# Patient Record
Sex: Female | Born: 1945 | Race: White | Hispanic: No | Marital: Married | Smoking: Current every day smoker
Health system: Southern US, Community
[De-identification: ages and names within clinical notes are randomized; demographics above are authoritative.]

## PROBLEM LIST (undated history)

## (undated) DIAGNOSIS — I2510 Atherosclerotic heart disease of native coronary artery without angina pectoris: Secondary | ICD-10-CM

## (undated) DIAGNOSIS — Z720 Tobacco use: Secondary | ICD-10-CM

## (undated) DIAGNOSIS — E785 Hyperlipidemia, unspecified: Secondary | ICD-10-CM

## (undated) DIAGNOSIS — I10 Essential (primary) hypertension: Secondary | ICD-10-CM

## (undated) DIAGNOSIS — E039 Hypothyroidism, unspecified: Secondary | ICD-10-CM

## (undated) DIAGNOSIS — G8929 Other chronic pain: Secondary | ICD-10-CM

---

## 2016-03-02 HISTORY — PX: LAPAROSCOPIC CHOLECYSTECTOMY: SUR755

## 2020-05-12 ENCOUNTER — Encounter: Payer: Self-pay | Admitting: Emergency Medicine

## 2020-05-12 ENCOUNTER — Emergency Department: Payer: Medicare HMO

## 2020-05-12 ENCOUNTER — Other Ambulatory Visit: Payer: Self-pay

## 2020-05-12 ENCOUNTER — Inpatient Hospital Stay
Admission: EM | Admit: 2020-05-12 | Discharge: 2020-05-27 | DRG: 480 | Disposition: A | Discharge status: Skilled Nursing Facility | Payer: Medicare HMO | Attending: Internal Medicine | Admitting: Internal Medicine

## 2020-05-12 DIAGNOSIS — E43 Unspecified severe protein-calorie malnutrition: Secondary | ICD-10-CM | POA: Insufficient documentation

## 2020-05-12 DIAGNOSIS — K567 Ileus, unspecified: Secondary | ICD-10-CM | POA: Diagnosis not present

## 2020-05-12 DIAGNOSIS — Z20822 Contact with and (suspected) exposure to covid-19: Secondary | ICD-10-CM | POA: Diagnosis present

## 2020-05-12 DIAGNOSIS — R609 Edema, unspecified: Secondary | ICD-10-CM

## 2020-05-12 DIAGNOSIS — K219 Gastro-esophageal reflux disease without esophagitis: Secondary | ICD-10-CM | POA: Diagnosis present

## 2020-05-12 DIAGNOSIS — M545 Low back pain: Secondary | ICD-10-CM | POA: Diagnosis not present

## 2020-05-12 DIAGNOSIS — Z4659 Encounter for fitting and adjustment of other gastrointestinal appliance and device: Secondary | ICD-10-CM

## 2020-05-12 DIAGNOSIS — S72141A Displaced intertrochanteric fracture of right femur, initial encounter for closed fracture: Secondary | ICD-10-CM | POA: Diagnosis present

## 2020-05-12 DIAGNOSIS — Z0189 Encounter for other specified special examinations: Secondary | ICD-10-CM

## 2020-05-12 DIAGNOSIS — G8929 Other chronic pain: Secondary | ICD-10-CM | POA: Diagnosis present

## 2020-05-12 DIAGNOSIS — Z9049 Acquired absence of other specified parts of digestive tract: Secondary | ICD-10-CM

## 2020-05-12 DIAGNOSIS — R252 Cramp and spasm: Secondary | ICD-10-CM | POA: Diagnosis not present

## 2020-05-12 DIAGNOSIS — I2510 Atherosclerotic heart disease of native coronary artery without angina pectoris: Secondary | ICD-10-CM | POA: Diagnosis present

## 2020-05-12 DIAGNOSIS — E785 Hyperlipidemia, unspecified: Secondary | ICD-10-CM | POA: Diagnosis present

## 2020-05-12 DIAGNOSIS — Z681 Body mass index (BMI) 19 or less, adult: Secondary | ICD-10-CM

## 2020-05-12 DIAGNOSIS — E039 Hypothyroidism, unspecified: Secondary | ICD-10-CM | POA: Diagnosis present

## 2020-05-12 DIAGNOSIS — W010XXA Fall on same level from slipping, tripping and stumbling without subsequent striking against object, initial encounter: Secondary | ICD-10-CM | POA: Diagnosis present

## 2020-05-12 DIAGNOSIS — Z882 Allergy status to sulfonamides status: Secondary | ICD-10-CM

## 2020-05-12 DIAGNOSIS — R52 Pain, unspecified: Secondary | ICD-10-CM

## 2020-05-12 DIAGNOSIS — I10 Essential (primary) hypertension: Secondary | ICD-10-CM | POA: Diagnosis present

## 2020-05-12 DIAGNOSIS — E876 Hypokalemia: Secondary | ICD-10-CM | POA: Diagnosis present

## 2020-05-12 DIAGNOSIS — E222 Syndrome of inappropriate secretion of antidiuretic hormone: Secondary | ICD-10-CM | POA: Diagnosis present

## 2020-05-12 DIAGNOSIS — F419 Anxiety disorder, unspecified: Secondary | ICD-10-CM | POA: Diagnosis present

## 2020-05-12 DIAGNOSIS — E871 Hypo-osmolality and hyponatremia: Secondary | ICD-10-CM | POA: Diagnosis not present

## 2020-05-12 DIAGNOSIS — M1711 Unilateral primary osteoarthritis, right knee: Secondary | ICD-10-CM | POA: Diagnosis present

## 2020-05-12 DIAGNOSIS — Z720 Tobacco use: Secondary | ICD-10-CM | POA: Diagnosis present

## 2020-05-12 DIAGNOSIS — F17210 Nicotine dependence, cigarettes, uncomplicated: Secondary | ICD-10-CM | POA: Diagnosis present

## 2020-05-12 DIAGNOSIS — Z955 Presence of coronary angioplasty implant and graft: Secondary | ICD-10-CM

## 2020-05-12 DIAGNOSIS — S72001A Fracture of unspecified part of neck of right femur, initial encounter for closed fracture: Secondary | ICD-10-CM | POA: Diagnosis not present

## 2020-05-12 DIAGNOSIS — R140 Abdominal distension (gaseous): Secondary | ICD-10-CM

## 2020-05-12 DIAGNOSIS — D62 Acute posthemorrhagic anemia: Secondary | ICD-10-CM | POA: Diagnosis not present

## 2020-05-12 DIAGNOSIS — Z419 Encounter for procedure for purposes other than remedying health state, unspecified: Secondary | ICD-10-CM

## 2020-05-12 DIAGNOSIS — W19XXXA Unspecified fall, initial encounter: Secondary | ICD-10-CM

## 2020-05-12 HISTORY — DX: Essential (primary) hypertension: I10

## 2020-05-12 HISTORY — DX: Hypothyroidism, unspecified: E03.9

## 2020-05-12 HISTORY — DX: Atherosclerotic heart disease of native coronary artery without angina pectoris: I25.10

## 2020-05-12 HISTORY — DX: Other chronic pain: G89.29

## 2020-05-12 HISTORY — DX: Hyperlipidemia, unspecified: E78.5

## 2020-05-12 HISTORY — DX: Tobacco use: Z72.0

## 2020-05-12 LAB — CBC
HCT: 32.7 % — ABNORMAL LOW (ref 36.0–46.0)
Hemoglobin: 12 g/dL (ref 12.0–15.0)
MCH: 30.5 pg (ref 26.0–34.0)
MCHC: 36.7 g/dL — ABNORMAL HIGH (ref 30.0–36.0)
MCV: 83.2 fL (ref 80.0–100.0)
Platelets: 284 10*3/uL (ref 150–400)
RBC: 3.93 MIL/uL (ref 3.87–5.11)
RDW: 14.4 % (ref 11.5–15.5)
WBC: 19.3 10*3/uL — ABNORMAL HIGH (ref 4.0–10.5)
nRBC: 0 % (ref 0.0–0.2)

## 2020-05-12 LAB — SARS CORONAVIRUS 2 BY RT PCR (HOSPITAL ORDER, PERFORMED IN ~~LOC~~ HOSPITAL LAB): SARS Coronavirus 2: NEGATIVE

## 2020-05-12 LAB — MAGNESIUM: Magnesium: 1.9 mg/dL (ref 1.7–2.4)

## 2020-05-12 LAB — BASIC METABOLIC PANEL
Anion gap: 7 (ref 5–15)
BUN: 10 mg/dL (ref 8–23)
CO2: 25 mmol/L (ref 22–32)
Calcium: 8.7 mg/dL — ABNORMAL LOW (ref 8.9–10.3)
Chloride: 90 mmol/L — ABNORMAL LOW (ref 98–111)
Creatinine, Ser: 0.72 mg/dL (ref 0.44–1.00)
GFR calc Af Amer: >60 mL/min (ref >60)
GFR calc non Af Amer: >60 mL/min (ref >60)
Glucose, Bld: 118 mg/dL — ABNORMAL HIGH (ref 70–99)
Potassium: 3.3 mmol/L — ABNORMAL LOW (ref 3.5–5.1)
Sodium: 122 mmol/L — ABNORMAL LOW (ref 135–145)

## 2020-05-12 LAB — PROTIME-INR
INR: 1 (ref 0.8–1.2)
Prothrombin Time: 12.8 seconds (ref 11.4–15.2)

## 2020-05-12 LAB — TSH: TSH: 2.724 u[IU]/mL (ref 0.350–4.500)

## 2020-05-12 LAB — OSMOLALITY: Osmolality: 260 mOsm/kg — ABNORMAL LOW (ref 275–295)

## 2020-05-12 LAB — TYPE AND SCREEN
ABO/RH(D): A POS
Antibody Screen: NEGATIVE

## 2020-05-12 MED ORDER — OXYCODONE-ACETAMINOPHEN 5-325 MG PO TABS: 1.0000 | ORAL_TABLET | ORAL | 0 refills | Status: DC | PRN

## 2020-05-12 MED ORDER — LISINOPRIL 20 MG PO TABS
20.0000 mg | ORAL_TABLET | Freq: Every day | ORAL | Status: DC
  Administered 2020-05-14 – 2020-05-27 (×14): 20 mg via ORAL
  Filled 2020-05-12 (×14): qty 1

## 2020-05-12 MED ORDER — MORPHINE SULFATE (PF) 4 MG/ML IV SOLN
4.0000 mg | Freq: Once | INTRAVENOUS | Status: AC
  Administered 2020-05-12: 4 mg via INTRAVENOUS
  Filled 2020-05-12: qty 1

## 2020-05-12 MED ORDER — AMLODIPINE BESYLATE 10 MG PO TABS
10.0000 mg | ORAL_TABLET | Freq: Every day | ORAL | Status: DC
  Administered 2020-05-14 – 2020-05-27 (×14): 10 mg via ORAL
  Filled 2020-05-12 (×14): qty 1

## 2020-05-12 MED ORDER — OXYCODONE HCL 5 MG PO TABS
10.0000 mg | ORAL_TABLET | Freq: Four times a day (QID) | ORAL | Status: DC | PRN
  Administered 2020-05-12 – 2020-05-14 (×3): 10 mg via ORAL
  Filled 2020-05-12 (×3): qty 2

## 2020-05-12 MED ORDER — PROMETHAZINE HCL 25 MG PO TABS
25.0000 mg | ORAL_TABLET | Freq: Three times a day (TID) | ORAL | Status: DC | PRN
  Administered 2020-05-12: 25 mg via ORAL
  Filled 2020-05-12 (×2): qty 1

## 2020-05-12 MED ORDER — METHYLPREDNISOLONE ACETATE 80 MG/ML IJ SUSP
80.0000 mg | Freq: Once | INTRAMUSCULAR | Status: AC
  Administered 2020-05-12: 80 mg via INTRAMUSCULAR
  Filled 2020-05-12: qty 1

## 2020-05-12 MED ORDER — LEVOTHYROXINE SODIUM 25 MCG PO TABS
125.0000 ug | ORAL_TABLET | Freq: Every day | ORAL | Status: DC
  Administered 2020-05-14 – 2020-05-27 (×13): 125 ug via ORAL
  Filled 2020-05-12 (×13): qty 1

## 2020-05-12 MED ORDER — CYCLOBENZAPRINE HCL 10 MG PO TABS
5.0000 mg | ORAL_TABLET | Freq: Three times a day (TID) | ORAL | Status: DC | PRN
  Administered 2020-05-12 – 2020-05-17 (×6): 5 mg via ORAL
  Filled 2020-05-12 (×7): qty 1

## 2020-05-12 MED ORDER — HYDROCODONE-ACETAMINOPHEN 5-325 MG PO TABS
1.0000 | ORAL_TABLET | Freq: Four times a day (QID) | ORAL | Status: DC | PRN
  Administered 2020-05-12: 1 via ORAL
  Filled 2020-05-12: qty 1

## 2020-05-12 MED ORDER — HYDROMORPHONE HCL 1 MG/ML IJ SOLN
1.0000 mg | Freq: Once | INTRAMUSCULAR | Status: AC
  Administered 2020-05-12: 1 mg via INTRAVENOUS
  Filled 2020-05-12: qty 1

## 2020-05-12 MED ORDER — POTASSIUM CHLORIDE IN NACL 20-0.9 MEQ/L-% IV SOLN
INTRAVENOUS | Status: AC
  Filled 2020-05-12: qty 1000

## 2020-05-12 MED ORDER — SENNOSIDES-DOCUSATE SODIUM 8.6-50 MG PO TABS: 1.0000 | ORAL_TABLET | Freq: Every evening | ORAL | Status: DC | PRN

## 2020-05-12 MED ORDER — MORPHINE SULFATE (PF) 2 MG/ML IV SOLN
2.0000 mg | INTRAVENOUS | Status: DC | PRN
  Administered 2020-05-12 – 2020-05-13 (×4): 2 mg via INTRAVENOUS
  Filled 2020-05-12 (×5): qty 1

## 2020-05-12 MED ORDER — ONDANSETRON HCL 4 MG/2ML IJ SOLN
4.0000 mg | Freq: Once | INTRAMUSCULAR | Status: AC
  Administered 2020-05-12: 4 mg via INTRAVENOUS
  Filled 2020-05-12: qty 2

## 2020-05-12 MED ORDER — MORPHINE SULFATE (PF) 2 MG/ML IV SOLN
0.5000 mg | INTRAVENOUS | Status: DC | PRN
  Administered 2020-05-12 (×2): 0.5 mg via INTRAVENOUS
  Filled 2020-05-12 (×2): qty 1

## 2020-05-12 MED ORDER — ONDANSETRON HCL 4 MG/2ML IJ SOLN
4.0000 mg | Freq: Four times a day (QID) | INTRAMUSCULAR | Status: DC | PRN
  Administered 2020-05-12 – 2020-05-13 (×2): 4 mg via INTRAVENOUS
  Filled 2020-05-12 (×4): qty 2

## 2020-05-12 MED ORDER — PANTOPRAZOLE SODIUM 40 MG PO TBEC
40.0000 mg | DELAYED_RELEASE_TABLET | Freq: Every day | ORAL | Status: DC
  Administered 2020-05-14 – 2020-05-27 (×13): 40 mg via ORAL
  Filled 2020-05-12 (×13): qty 1

## 2020-05-12 MED ORDER — CARVEDILOL 3.125 MG PO TABS
3.1250 mg | ORAL_TABLET | Freq: Two times a day (BID) | ORAL | Status: DC
  Administered 2020-05-12 – 2020-05-27 (×29): 3.125 mg via ORAL
  Filled 2020-05-12 (×29): qty 1

## 2020-05-12 MED ORDER — PROMETHAZINE HCL 25 MG PO TABS
25.0000 mg | ORAL_TABLET | Freq: Once | ORAL | Status: AC
  Administered 2020-05-12: 25 mg via ORAL
  Filled 2020-05-12: qty 1

## 2020-05-12 MED ORDER — POTASSIUM CHLORIDE 20 MEQ/15ML (10%) PO SOLN
40.0000 meq | Freq: Once | ORAL | Status: AC
  Administered 2020-05-12: 40 meq via ORAL
  Filled 2020-05-12: qty 30

## 2020-05-12 MED ORDER — PRAVASTATIN SODIUM 20 MG PO TABS
20.0000 mg | ORAL_TABLET | Freq: Every day | ORAL | Status: DC
  Administered 2020-05-14 – 2020-05-26 (×12): 20 mg via ORAL
  Filled 2020-05-12 (×13): qty 1

## 2020-05-12 MED ORDER — CYCLOBENZAPRINE HCL 10 MG PO TABS
5.0000 mg | ORAL_TABLET | Freq: Once | ORAL | Status: AC
  Administered 2020-05-12: 5 mg via ORAL
  Filled 2020-05-12: qty 1

## 2020-05-12 NOTE — ED Triage Notes (Signed)
Pt arrival via [facility] from home due to fall. Patient states that she lost her footing and fell on her right side. Patient is complaining of right leg, knee, and hip pain and some knee deformity according to EMS. Patinet received 4 of zofran and 100 mcg of fentanyl in route IV. HR 90's, R 20, CBG 138, 95% RA, 174/86 BP.   Pt pain 10/10 now.

## 2020-05-12 NOTE — ED Notes (Signed)
Report provided   

## 2020-05-12 NOTE — H&P (Signed)
History and Physical    Rebecca Peck CVE:[id number] DOB: 26-Sep-1946 DOA: 05/12/2020  PCP: Patient, No Pcp Per  Patient coming from: Home via EMS  I have personally briefly reviewed patient's old medical records in [facility]  Chief Complaint: Fall with right leg pain  HPI: Rebecca Peck is a 74 y.o. female with medical history significant for CAD with remote stent placement, hypertension, hyperlipidemia, hypothyroidism, GERD, tobacco use, and chronic back pain who presents to the ED for evaluation of right leg pain after a fall.  Patient states she was at home when she tripped and fell onto her right side.  She says she did not hit her head or lose consciousness.  She was having significant pain in her right leg most notably at the knee.  She was unable to stand or bear weight on her own.  She came to the ED for further evaluation.  She denies any associated chest pain, palpitations, dyspnea, cough, nausea, vomiting, abdominal pain, dysuria, diarrhea.  She denies any recent changes in her medications.  She reports chronic tobacco use, smoking half a pack per day.  She denies any alcohol or illicit drug use.  She reports a family history of an unspecified blood disorder in her son.  ED Course:  Initial vitals showed BP 169/91, pulse 83, RR 18, temp 98.0 Fahrenheit, SPO2 97% on room air.  Labs not obtained yet.  Right knee x-ray shows tricompartmental osteoarthritis without acute osseous abnormality.  Pelvic and right femur x-rays show a right femoral intertrochanteric fracture with varus angulation.  Portable chest x-ray is negative for focal consolidation, edema, or effusion.  Patient was given IV morphine, Dilaudid, IM Depo-Medrol 80 mg, and Zofran.  EDP discussed with on-call orthopedics who recommended medical admission with plan for surgical fixation tomorrow.  The hospitalist service was consulted admit for further evaluation and management.  Review of Systems: All  systems reviewed and are negative except as documented in history of present illness above.   Past Medical History:  Diagnosis Date  . CAD (coronary artery disease)   . Chronic back pain   . Hyperlipidemia   . Hypertension   . Hypothyroidism   . Tobacco use     Past Surgical History:  Procedure Laterality Date  . LAPAROSCOPIC CHOLECYSTECTOMY  03/02/2016    Social History:  reports that she has been smoking. She has been smoking about 0.50 packs per day. She has never used smokeless tobacco. She reports previous alcohol use. No history on file for drug use.  Allergies  Allergen Reactions  . Sulfa Antibiotics Rash    History reviewed.  Patient reports unspecified blood disorder in herself.   Prior to Admission medications   Medication Sig Start Date End Date Taking? Authorizing Provider  oxyCODONE-acetaminophen (PERCOCET) 5-325 MG tablet Take 1 tablet by mouth every 4 (four) hours as needed for severe pain. 05/12/20 05/12/21  Arnaldo Natal, MD    Physical Exam: Vitals:   05/12/20 1551 05/12/20 1556 05/12/20 1730  BP:  (!) 169/91 (!) 162/85  Pulse:  83 81  Resp:  18 16  Temp:  98 F (36.7 C)   TempSrc:  Oral   SpO2:  97% 97%  Weight: 55.3 kg    Height: 5\' 6"  (1.676 m)     Constitutional: Elderly woman resting supine in bed, NAD, calm, intermittent pain spells with minimal movement of her right hip Eyes: PERRL, lids and conjunctivae normal ENMT: Mucous membranes are dry. Posterior pharynx clear  of any exudate or lesions.Normal dentition.  Neck: normal, supple, no masses. Respiratory: clear to auscultation anteriorly normal respiratory effort. No accessory muscle use.  Cardiovascular: Regular rate and rhythm, no murmurs / rubs / gallops. No extremity edema. 2+ pedal pulses. Abdomen: no tenderness, no masses palpated. No hepatosplenomegaly. Bowel sounds positive.  Musculoskeletal: Right hip externally rotated, right foot everted, ROM otherwise intact in upper and left  lower extremities. skin: no rashes, lesions, ulcers. No induration Neurologic: CN 2-12 grossly intact. Sensation intact, Strength limited at the right hip due to pain/fracture otherwise 5/5 in all other extremities. Psychiatric: Normal judgment and insight. Alert and oriented x 3. Normal mood.   Labs on Admission: I have personally reviewed following labs and imaging studies  CBC: No results for input(s): WBC, NEUTROABS, HGB, HCT, MCV, PLT in the last 168 hours. Basic Metabolic Panel: No results for input(s): NA, K, CL, CO2, GLUCOSE, BUN, CREATININE, CALCIUM, MG, PHOS in the last 168 hours. GFR: CrCl cannot be calculated (No successful lab value found.). Liver Function Tests: No results for input(s): AST, ALT, ALKPHOS, BILITOT, PROT, ALBUMIN in the last 168 hours. No results for input(s): LIPASE, AMYLASE in the last 168 hours. No results for input(s): AMMONIA in the last 168 hours. Coagulation Profile: No results for input(s): INR, PROTIME in the last 168 hours. Cardiac Enzymes: No results for input(s): CKTOTAL, CKMB, CKMBINDEX, TROPONINI in the last 168 hours. BNP (last 3 results) No results for input(s): PROBNP in the last 8760 hours. HbA1C: No results for input(s): HGBA1C in the last 72 hours. CBG: No results for input(s): GLUCAP in the last 168 hours. Lipid Profile: No results for input(s): CHOL, HDL, LDLCALC, TRIG, CHOLHDL, LDLDIRECT in the last 72 hours. Thyroid Function Tests: No results for input(s): TSH, T4TOTAL, FREET4, T3FREE, THYROIDAB in the last 72 hours. Anemia Panel: No results for input(s): VITAMINB12, FOLATE, FERRITIN, TIBC, IRON, RETICCTPCT in the last 72 hours. Urine analysis: No results found for: COLORURINE, APPEARANCEUR, LABSPEC, PHURINE, GLUCOSEU, HGBUR, BILIRUBINUR, KETONESUR, PROTEINUR, UROBILINOGEN, NITRITE, LEUKOCYTESUR  Radiological Exams on Admission: DG Chest 1 View  Result Date: 05/12/2020 CLINICAL DATA:  Fall EXAM: CHEST  1 VIEW COMPARISON:   None. FINDINGS: Heart and mediastinal contours are within normal limits. No focal opacities or effusions. No acute bony abnormality. Aortic atherosclerosis. No pneumothorax. IMPRESSION: No active disease. Electronically Signed   By: Charlett Nose M.D.   On: 05/12/2020 18:14   DG Pelvis 1-2 Views  Result Date: 05/12/2020 CLINICAL DATA:  Fall, right hip pain, leg pain EXAM: PELVIS - 1-2 VIEW COMPARISON:  None. FINDINGS: There is a right femoral intertrochanteric fracture with varus angulation. No subluxation or dislocation. Mild symmetric degenerative changes in the hips. IMPRESSION: Right femoral intertrochanteric fracture with varus angulation. Electronically Signed   By: Charlett Nose M.D.   On: 05/12/2020 18:13   DG Knee 1-2 Views Right  Result Date: 05/12/2020 CLINICAL DATA:  Fall. EXAM: RIGHT KNEE - 1-2 VIEW COMPARISON:  None. FINDINGS: No acute fracture or dislocation. Trace suprapatellar joint effusion. Moderate medial and lateral compartment joint space narrowing. Small tricompartmental marginal osteophytes. Osteopenia. Soft tissues are unremarkable. Vascular calcifications. IMPRESSION: 1. No acute osseous abnormality. 2. Tricompartmental osteoarthritis. Electronically Signed   By: Obie Dredge M.D.   On: 05/12/2020 16:36   DG Femur Min 2 Views Right  Result Date: 05/12/2020 CLINICAL DATA:  Fall.  Right hip pain EXAM: RIGHT FEMUR 2 VIEWS COMPARISON:  None. FINDINGS: There is a right femoral intertrochanteric fracture with varus angulation.  No subluxation or dislocation. No additional acute femoral abnormality. No joint effusion within the right knee. IMPRESSION: Right femoral intertrochanteric fracture with varus angulation. Electronically Signed   By: Charlett Nose M.D.   On: 05/12/2020 18:12    EKG: Ordered and pending.  Assessment/Plan Principal Problem:   Intertrochanteric fracture of right femur, closed, initial encounter (HCC) Active Problems:   Hypothyroidism   Hypertension    Hyperlipidemia   Chronic back pain   CAD (coronary artery disease)   Tobacco use  Rebecca Peck is a 74 y.o. female with medical history significant for CAD with remote stent placement, hypertension, hyperlipidemia, hypothyroidism, GERD, tobacco use, and chronic back pain who is admitted with a right hip fracture.  Right hip fracture: Occurring after a mechanical fall.  Orthopedics consulted and plan for surgical fixation tomorrow.  Patient has a history of CAD with remote stent placement, denies any recent chest pain.  She would be considered to have a 6% perioperative 30-day risk of death, MI, or cardiac arrest. -Obtain baseline labs with CBC, BMP, type and screen -Continue pain control with home oxycodone and IV morphine as needed with hold parameters -Keep n.p.o. at midnight  CAD: Patient reports history of stenting years ago.  Denies any recent chest pain. -Continue carvedilol and pravastatin  Chronic hyponatremia: Review of labs in care everywhere show chronic mild hyponatremia.  Obtain BMP.  Hypertension: -Continue carvedilol, amlodipine, and lisinopril  Hyperlipidemia: -Continue pravastatin  Hypothyroidism: -Continue Synthroid  Chronic back pain: Continue home oxycodone and add IV morphine as needed as above.  DVT prophylaxis: SCDs Code Status: Full code, confirmed with patient Family Communication: Discussed with patient's son at bedside Disposition Plan: From home, anticipate discharge to SNF pending orthopedic intervention and postop recovery, PT/OT eval's Consults called: Orthopedics Admission status:  Status is: Inpatient  Remains inpatient appropriate because:Ongoing active pain requiring inpatient pain management, Unsafe d/c plan and Inpatient level of care appropriate due to severity of illness   Dispo: The patient is from: Home              Anticipated d/c is to: Home versus SNF pending orthopedic intervention and postop recovery and PT/OT eval's               Anticipated d/c date is: 3 days              Patient currently is not medically stable to d/c.  Darreld Mclean MD [facility]  If 7PM-7AM, please contact night-coverage [email or web address]  05/12/2020, 7:47 PM

## 2020-05-12 NOTE — Discharge Instructions (Addendum)
Diet: As you were doing prior to hospitalization   Shower:  May shower but keep the wounds dry, use an occlusive plastic wrap, NO SOAKING IN TUB.  If the bandage gets wet, change with a clean dry gauze.  Dressing:  You may change your dressing as needed. Change the dressing with sterile gauze dressing.    Activity:  Increase activity slowly as tolerated, but follow the weight bearing instructions below.  No lifting or driving for 6 weeks.  Weight Bearing:   Weight bearing as tolerated to right lower extremity  To prevent constipation: you may use a stool softener such as -  Colace (over the counter) 100 mg by mouth twice a day  Drink plenty of fluids (prune juice may be helpful) and high fiber foods Miralax (over the counter) for constipation as needed.    Itching:  If you experience itching with your medications, try taking only a single pain pill, or even half a pain pill at a time.  You may take up to 10 pain pills per day, and you can also use benadryl over the counter for itching or also to help with sleep.   Precautions:  If you experience chest pain or shortness of breath - call 911 immediately for transfer to the hospital emergency department!!  If you develop a fever greater that 101 F, purulent drainage from wound, increased redness or drainage from wound, or calf pain-Call [facility]                                              Follow- Up Appointment:  Please call for an appointment to be seen in 2 weeks at [facility].  #1.  Fluid restriction less than 1500 mL/day for hyponatremia.

## 2020-05-12 NOTE — ED Provider Notes (Addendum)
[facility] Emergency Department Provider Note   ____________________________________________   First MD Initiated Contact with Patient 05/12/20 1556     (approximate)  I have reviewed the triage vital signs and the nursing notes.   HISTORY  Chief Complaint Fall    HPI Rebecca Peck is a 74 y.o. female who reports she lost her footing and fell.  She landed on her right side.  She has pain in her right leg.  Most of the pain appears to be centered on her right knee which she cannot straighten.  Knee is very tender to palpation.  Her hips are not tender to palpation either is the proximal thigh or shin.         History reviewed. No pertinent past medical history.  There are no problems to display for this patient.   History reviewed. No pertinent surgical history.  Prior to Admission medications   Medication Sig Start Date End Date Taking? Authorizing Provider  oxyCODONE-acetaminophen (PERCOCET) 5-325 MG tablet Take 1 tablet by mouth every 4 (four) hours as needed for severe pain. 05/12/20 05/12/21  Arnaldo Natal, MD    Allergies Patient has no known allergies.  History reviewed. No pertinent family history.  Social History Social History   Tobacco Use  . Smoking status: Current Every Day Smoker    Packs/day: 0.50  . Smokeless tobacco: Never Used  Substance Use Topics  . Alcohol use: Not Currently  . Drug use: Not on file    Review of Systems  Constitutional: No fever/chills Eyes: No visual changes. ENT: No sore throat. Cardiovascular: Denies chest pain. Respiratory: Denies shortness of breath. Gastrointestinal: No abdominal pain.  No nausea, no vomiting.  No diarrhea.  No constipation. Genitourinary: Negative for dysuria. Musculoskeletal: Negative for back pain. Skin: Negative for rash. Neurological: Negative for headaches, focal weakness   ____________________________________________   PHYSICAL EXAM:  VITAL  SIGNS: ED Triage Vitals  Enc Vitals Group     BP 05/12/20 1556 (!) 169/91     Pulse Rate 05/12/20 1556 83     Resp 05/12/20 1556 18     Temp 05/12/20 1556 98 F (36.7 C)     Temp Source 05/12/20 1556 Oral     SpO2 05/12/20 1556 97 %     Weight 05/12/20 1551 122 lb (55.3 kg)     Height 05/12/20 1551 5\' 6"  (1.676 m)     Head Circumference --      Peak Flow --      Pain Score 05/12/20 1550 10     Pain Loc --      Pain Edu? --      Excl. in GC? --     Constitutional: Alert and oriented.  Complaining of pain in the leg Eyes: Conjunctivae are normal. PER. EOMI. Head: Atraumatic. Nose: No congestion/rhinnorhea. Mouth/Throat: Mucous membranes are moist.  Oropharynx non-erythematous. Neck: No stridor.  No cervical spine tenderness to palpation. Cardiovascular: Normal rate, regular rhythm. Grossly normal heart sounds.  Good peripheral circulation. Respiratory: Normal respiratory effort.  No retractions. Lungs CTAB. Gastrointestinal: Soft and nontender. No distention. No abdominal bruits. No CVA tenderness. Musculoskeletal: No lower extremity tenderness nor edema except as noted in HPI.   Neurologic:  Normal speech and language. No gross focal neurologic deficits are appreciated.  Skin:  Skin is warm, dry and intact. No rash noted.   ____________________________________________   LABS (all labs ordered are listed, but only abnormal results are displayed)  Labs Reviewed  SARS CORONAVIRUS 2 BY RT PCR (HOSPITAL ORDER, PERFORMED IN [facility])   ____________________________________________  EKG   ____________________________________________  RADIOLOGY  ED MD interpretation  Official radiology report(s): DG Knee 1-2 Views Right  Result Date: 05/12/2020 CLINICAL DATA:  Fall. EXAM: RIGHT KNEE - 1-2 VIEW COMPARISON:  None. FINDINGS: No acute fracture or dislocation. Trace suprapatellar joint effusion. Moderate medial and lateral compartment joint space narrowing.  Small tricompartmental marginal osteophytes. Osteopenia. Soft tissues are unremarkable. Vascular calcifications. IMPRESSION: 1. No acute osseous abnormality. 2. Tricompartmental osteoarthritis. Electronically Signed   By: Obie Dredge M.D.   On: 05/12/2020 16:36    ____________________________________________   PROCEDURES  Procedure(s) performed (including Critical Care):  Procedures   ____________________________________________   INITIAL IMPRESSION / ASSESSMENT AND PLAN / ED COURSE    Discussed patient in detail with Dr. Trilby Drummer orthopedics.  He also reviewed the x-rays.  There are no fractures.  She does have bad osteoarthritis.  She seems to have a locked joint.  We will splint her the way she is.  Dr. Loreta Ave suggested some I am Depo-Medrol to see if that will help her pain and inflammation.  He will follow her up in the office in a couple days.  Meantime we will have her walk on crutches were knee immobilizer and give her some pain medicine.  Since she has an acute injury hopefully this will not interfere with her pain clinic.    Patient's family members here with her.  Asked me to x-ray her hip just to make sure even though it is again not painful when I palpated her hip with him.  Hip x-ray shows a displaced fracture.  I have called Dr. Rosita Kea we will admit this lady          ____________________________________________   FINAL CLINICAL IMPRESSION(S) / ED DIAGNOSES  Final diagnoses:  Closed fracture of right hip, initial encounter [facility])     ED Discharge Orders         Ordered    oxyCODONE-acetaminophen (PERCOCET) 5-325 MG tablet  Every 4 hours PRN     Discontinue  Reprint     05/12/20 1729           Note:  This document was prepared using Dragon voice recognition software and may include unintentional dictation errors.    Arnaldo Natal, MD 05/12/20 1729    Arnaldo Natal, MD 05/12/20 [phone]

## 2020-05-12 NOTE — ED Notes (Signed)
Pt states that she didn't hit her head or loss consciousness on her fall. Patient is having pain in the right side of her body.

## 2020-05-13 ENCOUNTER — Encounter
Admission: EM | Disposition: A | Discharge status: Skilled Nursing Facility | Payer: Self-pay | Source: Home / Self Care | Attending: Internal Medicine

## 2020-05-13 ENCOUNTER — Inpatient Hospital Stay: Payer: Medicare HMO | Admitting: Anesthesiology

## 2020-05-13 ENCOUNTER — Inpatient Hospital Stay: Payer: Medicare HMO

## 2020-05-13 ENCOUNTER — Encounter: Payer: Self-pay | Admitting: Internal Medicine

## 2020-05-13 DIAGNOSIS — S72141A Displaced intertrochanteric fracture of right femur, initial encounter for closed fracture: Principal | ICD-10-CM

## 2020-05-13 HISTORY — PX: FEMUR IM NAIL: SHX1597

## 2020-05-13 LAB — BASIC METABOLIC PANEL
Anion gap: 8 (ref 5–15)
BUN: 9 mg/dL (ref 8–23)
CO2: 23 mmol/L (ref 22–32)
Calcium: 8.5 mg/dL — ABNORMAL LOW (ref 8.9–10.3)
Chloride: 94 mmol/L — ABNORMAL LOW (ref 98–111)
Creatinine, Ser: 0.67 mg/dL (ref 0.44–1.00)
GFR calc Af Amer: >60 mL/min (ref >60)
GFR calc non Af Amer: >60 mL/min (ref >60)
Glucose, Bld: 100 mg/dL — ABNORMAL HIGH (ref 70–99)
Potassium: 4.2 mmol/L (ref 3.5–5.1)
Sodium: 125 mmol/L — ABNORMAL LOW (ref 135–145)

## 2020-05-13 LAB — CBC
HCT: 32 % — ABNORMAL LOW (ref 36.0–46.0)
Hemoglobin: 11 g/dL — ABNORMAL LOW (ref 12.0–15.0)
MCH: 29.5 pg (ref 26.0–34.0)
MCHC: 34.4 g/dL (ref 30.0–36.0)
MCV: 85.8 fL (ref 80.0–100.0)
Platelets: 242 10*3/uL (ref 150–400)
RBC: 3.73 MIL/uL — ABNORMAL LOW (ref 3.87–5.11)
RDW: 14.3 % (ref 11.5–15.5)
WBC: 12.5 10*3/uL — ABNORMAL HIGH (ref 4.0–10.5)
nRBC: 0 % (ref 0.0–0.2)

## 2020-05-13 LAB — VITAMIN D 25 HYDROXY (VIT D DEFICIENCY, FRACTURES): Vit D, 25-Hydroxy: 25.01 ng/mL — ABNORMAL LOW (ref 30–100)

## 2020-05-13 LAB — URINALYSIS, ROUTINE W REFLEX MICROSCOPIC
Bacteria, UA: NONE SEEN
Bilirubin Urine: NEGATIVE
Glucose, UA: NEGATIVE mg/dL
Hgb urine dipstick: NEGATIVE
Ketones, ur: NEGATIVE mg/dL
Leukocytes,Ua: NEGATIVE
Nitrite: NEGATIVE
Protein, ur: 30 mg/dL — AB
Specific Gravity, Urine: 1.023 (ref 1.005–1.030)
Squamous Epithelial / HPF: NONE SEEN (ref 0–5)
pH: 5 (ref 5.0–8.0)

## 2020-05-13 LAB — SODIUM, URINE, RANDOM: Sodium, Ur: 35 mmol/L

## 2020-05-13 LAB — OSMOLALITY, URINE: Osmolality, Ur: 471 mOsm/kg (ref 300–900)

## 2020-05-13 SURGERY — INSERTION, INTRAMEDULLARY ROD, FEMUR
Anesthesia: Spinal | Laterality: Right

## 2020-05-13 MED ORDER — SODIUM CHLORIDE (PF) 0.9 % IJ SOLN
INTRAMUSCULAR | Status: AC
  Filled 2020-05-13: qty 10

## 2020-05-13 MED ORDER — METOCLOPRAMIDE HCL 10 MG PO TABS: 5.0000 mg | ORAL_TABLET | Freq: Three times a day (TID) | ORAL | Status: DC | PRN

## 2020-05-13 MED ORDER — PROPOFOL 500 MG/50ML IV EMUL
INTRAVENOUS | Status: DC | PRN
  Administered 2020-05-13: 75 ug/kg/min via INTRAVENOUS

## 2020-05-13 MED ORDER — MAGNESIUM HYDROXIDE 400 MG/5ML PO SUSP
30.0000 mL | Freq: Every day | ORAL | Status: DC | PRN
  Administered 2020-05-15: 30 mL via ORAL
  Filled 2020-05-13 (×2): qty 30

## 2020-05-13 MED ORDER — PHENOL 1.4 % MT LIQD
1.0000 | OROMUCOSAL | Status: DC | PRN
  Filled 2020-05-13: qty 177

## 2020-05-13 MED ORDER — NEOMYCIN-POLYMYXIN B GU 40-200000 IR SOLN
Status: AC
  Filled 2020-05-13: qty 2

## 2020-05-13 MED ORDER — METOCLOPRAMIDE HCL 5 MG/ML IJ SOLN
5.0000 mg | Freq: Three times a day (TID) | INTRAMUSCULAR | Status: DC | PRN
  Administered 2020-05-14: 5 mg via INTRAVENOUS
  Filled 2020-05-13: qty 2

## 2020-05-13 MED ORDER — CEFAZOLIN SODIUM-DEXTROSE 1-4 GM/50ML-% IV SOLN
1.0000 g | Freq: Once | INTRAVENOUS | Status: DC
  Filled 2020-05-13: qty 50

## 2020-05-13 MED ORDER — FENTANYL CITRATE (PF) 100 MCG/2ML IJ SOLN
INTRAMUSCULAR | Status: DC | PRN
  Administered 2020-05-13 (×2): 25 ug via INTRAVENOUS

## 2020-05-13 MED ORDER — NEOMYCIN-POLYMYXIN B GU 40-200000 IR SOLN
Status: DC | PRN
  Administered 2020-05-13: 2 mL

## 2020-05-13 MED ORDER — CHLORHEXIDINE GLUCONATE CLOTH 2 % EX PADS
6.0000 | MEDICATED_PAD | Freq: Every day | CUTANEOUS | Status: DC
  Administered 2020-05-17 – 2020-05-21 (×2): 6 via TOPICAL

## 2020-05-13 MED ORDER — ALUM & MAG HYDROXIDE-SIMETH 200-200-20 MG/5ML PO SUSP: 30.0000 mL | ORAL | Status: DC | PRN

## 2020-05-13 MED ORDER — PROPOFOL 10 MG/ML IV BOLUS
INTRAVENOUS | Status: DC | PRN
  Administered 2020-05-13 (×2): 20 mg via INTRAVENOUS

## 2020-05-13 MED ORDER — PROMETHAZINE HCL 25 MG/ML IJ SOLN: 6.2500 mg | INTRAMUSCULAR | Status: DC | PRN

## 2020-05-13 MED ORDER — PHENYLEPHRINE HCL (PRESSORS) 10 MG/ML IV SOLN
INTRAVENOUS | Status: DC | PRN
  Administered 2020-05-13 (×2): 100 ug via INTRAVENOUS

## 2020-05-13 MED ORDER — LORAZEPAM 2 MG/ML IJ SOLN
0.5000 mg | Freq: Once | INTRAMUSCULAR | Status: AC
  Administered 2020-05-13: 0.5 mg via INTRAVENOUS
  Filled 2020-05-13: qty 1

## 2020-05-13 MED ORDER — CEFAZOLIN SODIUM-DEXTROSE 2-4 GM/100ML-% IV SOLN
2.0000 g | Freq: Four times a day (QID) | INTRAVENOUS | Status: AC
  Administered 2020-05-13 – 2020-05-14 (×3): 2 g via INTRAVENOUS
  Filled 2020-05-13 (×3): qty 100

## 2020-05-13 MED ORDER — ONDANSETRON HCL 4 MG PO TABS
4.0000 mg | ORAL_TABLET | Freq: Four times a day (QID) | ORAL | Status: DC | PRN
  Administered 2020-05-19 – 2020-05-27 (×3): 4 mg via ORAL
  Filled 2020-05-13 (×3): qty 1

## 2020-05-13 MED ORDER — MAGNESIUM CITRATE PO SOLN
1.0000 | Freq: Once | ORAL | Status: AC | PRN
  Administered 2020-05-18: 1 via ORAL
  Filled 2020-05-13 (×2): qty 296

## 2020-05-13 MED ORDER — FENTANYL CITRATE (PF) 100 MCG/2ML IJ SOLN: 25.0000 ug | INTRAMUSCULAR | Status: DC | PRN

## 2020-05-13 MED ORDER — HYDROMORPHONE HCL 1 MG/ML IJ SOLN
1.0000 mg | INTRAMUSCULAR | Status: DC | PRN
  Administered 2020-05-13 – 2020-05-23 (×12): 1 mg via INTRAVENOUS
  Filled 2020-05-13 (×13): qty 1

## 2020-05-13 MED ORDER — ENOXAPARIN SODIUM 40 MG/0.4ML ~~LOC~~ SOLN
40.0000 mg | SUBCUTANEOUS | Status: DC
  Administered 2020-05-14 – 2020-05-27 (×14): 40 mg via SUBCUTANEOUS
  Filled 2020-05-13 (×14): qty 0.4

## 2020-05-13 MED ORDER — LIDOCAINE HCL (CARDIAC) PF 100 MG/5ML IV SOSY
PREFILLED_SYRINGE | INTRAVENOUS | Status: DC | PRN
  Administered 2020-05-13: 50 mg via INTRAVENOUS

## 2020-05-13 MED ORDER — SODIUM CHLORIDE 0.9 % IV SOLN: INTRAVENOUS | Status: DC

## 2020-05-13 MED ORDER — MENTHOL 3 MG MT LOZG
1.0000 | LOZENGE | OROMUCOSAL | Status: DC | PRN
  Filled 2020-05-13: qty 9

## 2020-05-13 MED ORDER — LIDOCAINE HCL (PF) 2 % IJ SOLN
INTRAMUSCULAR | Status: AC
  Filled 2020-05-13: qty 5

## 2020-05-13 MED ORDER — HYDROMORPHONE HCL 1 MG/ML IJ SOLN
1.0000 mg | Freq: Once | INTRAMUSCULAR | Status: AC
  Administered 2020-05-13: 1 mg via INTRAVENOUS
  Filled 2020-05-13: qty 1

## 2020-05-13 MED ORDER — OXYCODONE HCL 5 MG PO TABS: 10.0000 mg | ORAL_TABLET | Freq: Once | ORAL | Status: DC | PRN

## 2020-05-13 MED ORDER — LACTATED RINGERS IV SOLN: INTRAVENOUS | Status: DC | PRN

## 2020-05-13 MED ORDER — BISACODYL 10 MG RE SUPP
10.0000 mg | Freq: Every day | RECTAL | Status: DC | PRN
  Administered 2020-05-18 (×2): 10 mg via RECTAL
  Filled 2020-05-13 (×3): qty 1

## 2020-05-13 MED ORDER — FAMOTIDINE IN NACL 20-0.9 MG/50ML-% IV SOLN
20.0000 mg | Freq: Once | INTRAVENOUS | Status: AC
  Administered 2020-05-13: 20 mg via INTRAVENOUS
  Filled 2020-05-13: qty 50

## 2020-05-13 MED ORDER — BUPIVACAINE HCL (PF) 0.5 % IJ SOLN
INTRAMUSCULAR | Status: DC | PRN
  Administered 2020-05-13: 2.6 mL

## 2020-05-13 MED ORDER — ONDANSETRON HCL 4 MG/2ML IJ SOLN
4.0000 mg | Freq: Four times a day (QID) | INTRAMUSCULAR | Status: DC | PRN
  Administered 2020-05-13 – 2020-05-21 (×7): 4 mg via INTRAVENOUS
  Filled 2020-05-13 (×5): qty 2

## 2020-05-13 MED ORDER — ZOLPIDEM TARTRATE 5 MG PO TABS: 5.0000 mg | ORAL_TABLET | Freq: Every evening | ORAL | Status: DC | PRN

## 2020-05-13 MED ORDER — FENTANYL CITRATE (PF) 100 MCG/2ML IJ SOLN
INTRAMUSCULAR | Status: AC
  Filled 2020-05-13: qty 2

## 2020-05-13 MED ORDER — ONDANSETRON HCL 4 MG/2ML IJ SOLN
4.0000 mg | Freq: Once | INTRAMUSCULAR | Status: AC
  Administered 2020-05-13: 4 mg via INTRAVENOUS

## 2020-05-13 MED ORDER — PROPOFOL 500 MG/50ML IV EMUL
INTRAVENOUS | Status: AC
  Filled 2020-05-13: qty 50

## 2020-05-13 MED ORDER — KETAMINE HCL 10 MG/ML IJ SOLN
INTRAMUSCULAR | Status: DC | PRN
  Administered 2020-05-13 (×2): 10 mg via INTRAVENOUS

## 2020-05-13 MED ORDER — DOCUSATE SODIUM 100 MG PO CAPS
100.0000 mg | ORAL_CAPSULE | Freq: Two times a day (BID) | ORAL | Status: DC
  Administered 2020-05-14 – 2020-05-18 (×9): 100 mg via ORAL
  Filled 2020-05-13 (×10): qty 1

## 2020-05-13 SURGICAL SUPPLY — 41 items
BIT DRILL 4.3MMS DISTAL GRDTED (BIT) ×1 IMPLANT
BNDG COHESIVE 4X5 TAN STRL (GAUZE/BANDAGES/DRESSINGS) ×3 IMPLANT
CANISTER SUCT 1200ML W/VALVE (MISCELLANEOUS) ×3 IMPLANT
CHLORAPREP W/TINT 26 (MISCELLANEOUS) ×3 IMPLANT
COVER WAND RF STERILE (DRAPES) ×3 IMPLANT
DRAPE 3/4 80X56 (DRAPES) ×3 IMPLANT
DRAPE SURG 17X11 SM STRL (DRAPES) ×3 IMPLANT
DRAPE U-SHAPE 47X51 STRL (DRAPES) ×3 IMPLANT
DRILL 4.3MMS DISTAL GRADUATED (BIT) ×3
DRSG OPSITE POSTOP 3X4 (GAUZE/BANDAGES/DRESSINGS) ×3 IMPLANT
ELECT CAUTERY BLADE 6.4 (BLADE) ×3 IMPLANT
ELECT REM PT RETURN 9FT ADLT (ELECTROSURGICAL)
ELECTRODE REM PT RTRN 9FT ADLT (ELECTROSURGICAL) IMPLANT
GAUZE SPONGE 4X4 12PLY STRL (GAUZE/BANDAGES/DRESSINGS) ×3 IMPLANT
GLOVE BIOGEL PI IND STRL 9 (GLOVE) ×1 IMPLANT
GLOVE BIOGEL PI INDICATOR 9 (GLOVE) ×2
GLOVE SURG SYN 9.0  PF PI (GLOVE) ×2
GLOVE SURG SYN 9.0 PF PI (GLOVE) ×1 IMPLANT
GOWN SRG 2XL LVL 4 RGLN SLV (GOWNS) ×1 IMPLANT
GOWN STRL NON-REIN 2XL LVL4 (GOWNS) ×2
GOWN STRL REUS W/ TWL LRG LVL3 (GOWN DISPOSABLE) ×1 IMPLANT
GOWN STRL REUS W/TWL LRG LVL3 (GOWN DISPOSABLE) ×2
GUIDEPIN VERSANAIL DSP 3.2X444 (ORTHOPEDIC DISPOSABLE SUPPLIES) ×6 IMPLANT
GUIDEWIRE BALL NOSE 100CM (WIRE) ×3 IMPLANT
HFN RH 130 DEG 9MM X 340MM (Nail) ×3 IMPLANT
IV NS 500ML (IV SOLUTION) ×2
IV NS 500ML BAXH (IV SOLUTION) ×1 IMPLANT
KIT TURNOVER KIT A (KITS) ×3 IMPLANT
MAT ABSORB  FLUID 56X50 GRAY (MISCELLANEOUS) ×2
MAT ABSORB FLUID 56X50 GRAY (MISCELLANEOUS) ×1 IMPLANT
NEEDLE FILTER BLUNT 18X 1/2SAF (NEEDLE) ×2
NEEDLE FILTER BLUNT 18X1 1/2 (NEEDLE) ×1 IMPLANT
PACK HIP COMPR (MISCELLANEOUS) ×3 IMPLANT
SCALPEL PROTECTED #15 DISP (BLADE) ×6 IMPLANT
SCREW BONE CORTICAL 5.0X44 (Screw) ×3 IMPLANT
SCREW LAG HIP NAIL 10.5X95 (Screw) ×3 IMPLANT
STAPLER SKIN PROX 35W (STAPLE) ×3 IMPLANT
SUT VIC AB 1 CT1 36 (SUTURE) ×3 IMPLANT
SUT VIC AB 2-0 CT1 (SUTURE) ×3 IMPLANT
SYR 10ML LL (SYRINGE) ×3 IMPLANT
TAPE MICROFOAM 4IN (TAPE) IMPLANT

## 2020-05-13 NOTE — Progress Notes (Signed)
Pt was transferred to the OR holding area for scheduled procedure via wheelchair.. Pt was accompanied with son and Husband. Pt experience some nausea and vomitting during transport. NT contacted the receiving nurse and was told to continue to bring the patient to the area.No antiemetics were given at this time.

## 2020-05-13 NOTE — Transfer of Care (Signed)
Immediate Anesthesia Transfer of Care Note  Patient: Rebecca Peck  Procedure(s) Performed: Procedure(s): INTRAMEDULLARY (IM) NAIL FEMORAL (Right)  Patient Location: PACU  Anesthesia Type:Spinal  Level of Consciousness: awake, alert  and oriented  Airway & Oxygen Therapy: Patient Spontanous Breathing and Patient connected to face mask oxygen  Post-op Assessment: Report given to RN and Post -op Vital signs reviewed and stable  Post vital signs: Reviewed and stable  Last Vitals:  Vitals:   05/13/20 1420 05/13/20 1637  BP: (!) 144/99 (!) 124/93  Pulse: 85 89  Resp: 18 19  Temp:  37.6 C  SpO2: 95% 100%    Complications: No apparent anesthesia complications

## 2020-05-13 NOTE — Plan of Care (Signed)

## 2020-05-13 NOTE — Op Note (Signed)
05/12/2020 - 05/13/2020  4:35 PM  PATIENT:  Rebecca Peck  74 y.o. female  PRE-OPERATIVE DIAGNOSIS:   Right hip fracture high intertrochanteric   POST-OPERATIVE DIAGNOSIS:   Right hip fracture high intertrochanteric  PROCEDURE:  Procedure(s): INTRAMEDULLARY (IM) NAIL FEMORAL (Right)  SURGEON: Leitha Schuller, MD  ASSISTANTS: None  ANESTHESIA:   spinal  EBL:  Total I/O In: -  Out: 300 [Urine:300]  BLOOD ADMINISTERED:none  DRAINS: none   LOCAL MEDICATIONS USED:  NONE  SPECIMEN:  No Specimen  DISPOSITION OF SPECIMEN:  N/A  COUNTS:  YES  TOURNIQUET:  * No tourniquets in log *  IMPLANTS: Biomet affixes 9 x 340 rod with 95 mm leg screw and 44 mm interlocking screw  DICTATION: .Dragon Dictation  Patient was brought to the operating room and after adequate spinal anesthesia was obtained the patient was transferred to the fracture table where the left leg is in the well-leg holder right foot in the traction boot.  Traction was applied and after C arm was brought in visualization showed essentially anatomic alignment.  The hip was then prepped and draped using the barrier drape method and appropriate patient identification timeout procedure completed.  A small incision was made proximally and a guidewire inserted into the tip of the trochanter advanced and proximal reaming carried out followed by placement of the long guidewire measurement made reaming at 11 mm gave chatter in the shaft and so a 9 x 340 rod was chosen and inserted without difficulty.  Was sent to the appropriate level and a lateral incision was made with the center guidepin placed it was then a near center center position.  Additionally a next wire was placed proximal proximal to this is an antirotation device temporarily.  Measurement was made drilling carried out and 95 mm leg screw inserted followed by release of traction and using the compression device there is good compression of the fracture site and AP and  lateral projections showing anatomic alignment.  The antirotation pin was removed and permanent C arm views were obtained.  The insertion handle was removed and getting perfect circles distally through the slotted hole a single screw was placed drilling measuring and placing a 44 mm 5.0 cortical screw.  Permanent C arm view obtained and the wounds thoroughly irrigated.  The deep fascia was repaired using #1 Vicryl 2-0 Vicryl subcutaneously and skin staples.  Xeroform and honeycomb dressings applied.  PLAN OF CARE: Continue as inpatient  PATIENT DISPOSITION:  PACU - hemodynamically stable.

## 2020-05-13 NOTE — Anesthesia Procedure Notes (Signed)
Spinal  Patient location during procedure: OR Start time: 05/13/2020 3:22 PM End time: 05/13/2020 3:32 PM Staffing Performed: resident/CRNA  Resident/CRNA: Silas Sacramento, CRNA Preanesthetic Checklist Completed: patient identified, IV checked, site marked, risks and benefits discussed, surgical consent, monitors and equipment checked, pre-op evaluation and timeout performed Spinal Block Patient position: right lateral decubitus Prep: DuraPrep Patient monitoring: heart rate, cardiac monitor, continuous pulse ox and blood pressure Approach: midline Location: L3-4 Injection technique: single-shot Needle Needle type: Sprotte and Pencan  Needle gauge: 25 G Needle length: 9 cm Assessment Sensory level: T4 Additional Notes IV functioning, monitors applied to pt. Expiration date of kit checked and confirmed to be in date. Sterile prep and drape, hand hygiene and sterile gloved used. Pt was positioned and spine was prepped in sterile fashion. Skin was anesthetized with lidocaine. Free flow of clear CSF obtained prior to injecting local anesthetic into CSF x 1 attempt. Spinal needle aspirated freely following injection. Needle was carefully withdrawn, and pt tolerated procedure well. Loss of motor and sensory on exam post injection.

## 2020-05-13 NOTE — Progress Notes (Signed)
PROGRESS NOTE    MERNA BALDI  PPI:[id number] DOB: Feb 03, 1946 DOA: 05/12/2020 PCP: Patient, No Pcp Per   Brief Narrative:  Rebecca Peck is a 74 y.o. female with medical history significant for CAD with remote stent placement, hypertension, hyperlipidemia, hypothyroidism, GERD, tobacco use, and chronic back pain who presents to the ED for evaluation of right leg pain after a fall.  Patient states she was at home when she tripped and fell onto her right side.  She says she did not hit her head or lose consciousness.  She was having significant pain in her right leg most notably at the knee.  She was unable to stand or bear weight on her own.  She came to the ED for further evaluation.  Pelvic and right femur x-rays show a right femoral intertrochanteric fracture with varus angulation.  Orthopedic was consulted and she was going for ORIF today.  Subjective: Patient continued to experience significant right hip pain.  She was also complaining of some indigestion.  Husband and son was both present in the room.  Assessment & Plan:   Principal Problem:   Intertrochanteric fracture of right femur, closed, initial encounter (HCC) Active Problems:   Hypothyroidism   Hypertension   Hyperlipidemia   Chronic back pain   CAD (coronary artery disease)   Tobacco use  Right hip fracture.  Secondary to mechanical fall. Patient is going for ORIF by orthopedic today. -Follow-up postsurgical recommendations. -Continue pain management. -Patient will need PT/OT evaluation after surgery.  History of CAD.  S/p PCI many years ago.  No chest pain. -Continue carvedilol and pravastatin.  Chronic hyponatremia.  Labs consistent with hypovolemic hyponatremia. -Some improvement in her sodium with IV fluid. -Continue to monitor.  Hypertension.  Blood pressure mildly elevated but patient is also experiencing pain. --Continue carvedilol, amlodipine, and lisinopril  Hyperlipidemia: -Continue  pravastatin  Hypothyroidism: -Continue Synthroid.  Chronic back pain: -Continue home oxycodone.   Objective: Vitals:   05/13/20 0414 05/13/20 0756 05/13/20 1206 05/13/20 1420  BP: (!) 145/75 (!) 160/85 (!) 149/87 (!) 144/99  Pulse: 79 85 84 85  Resp: 16 17 17 18   Temp: 99.1 F (37.3 C) 98.5 F (36.9 C) 98.5 F (36.9 C)   TempSrc: Oral Oral Oral   SpO2: 96% 96% 95% 95%  Weight:    55.3 kg  Height:    5\' 6"  (1.676 m)    Intake/Output Summary (Last 24 hours) at 05/13/2020 1634 Last data filed at 05/13/2020 1500 Gross per 24 hour  Intake 450 ml  Output 750 ml  Net -300 ml   Filed Weights   05/12/20 1551 05/13/20 1420  Weight: 55.3 kg 55.3 kg    Examination:  General exam: Pleasant elderly lady, appears calm and comfortable  Respiratory system: Clear to auscultation. Respiratory effort normal. Cardiovascular system: S1 & S2 heard, RRR. No JVD, murmurs, Gastrointestinal system: Soft, nontender, nondistended, bowel sounds positive. Central nervous system: Alert and oriented. No focal neurological deficits. Extremities: No edema, no cyanosis, pulses intact and symmetrical. Psychiatry: Judgement and insight appear normal.   DVT prophylaxis: SCDs.  Lovenox can be started after the surgery Code Status: Full Family Communication: Husband and son was updated at bedside Disposition Plan:  Status is: Inpatient  Remains inpatient appropriate because:Inpatient level of care appropriate due to severity of illness   Dispo: The patient is from: Home              Anticipated d/c is to: To be determined  Anticipated d/c date is: 2 days              Patient currently is not medically stable to d/c.   Consultants:   Orthopedic  Procedures:  Antimicrobials:   Data Reviewed: I have personally reviewed following labs and imaging studies  CBC: Recent Labs  Lab 05/12/20 1957 05/13/20 0427  WBC 19.3* 12.5*  HGB 12.0 11.0*  HCT 32.7* 32.0*  MCV 83.2 85.8    PLT 284 242   Basic Metabolic Panel: Recent Labs  Lab 05/12/20 1957 05/13/20 0427  NA 122* 125*  K 3.3* 4.2  CL 90* 94*  CO2 25 23  GLUCOSE 118* 100*  BUN 10 9  CREATININE 0.72 0.67  CALCIUM 8.7* 8.5*  MG 1.9  --    GFR: Estimated Creatinine Clearance: 53.9 mL/min (by C-G formula based on SCr of 0.67 mg/dL). Liver Function Tests: No results for input(s): AST, ALT, ALKPHOS, BILITOT, PROT, ALBUMIN in the last 168 hours. No results for input(s): LIPASE, AMYLASE in the last 168 hours. No results for input(s): AMMONIA in the last 168 hours. Coagulation Profile: Recent Labs  Lab 05/12/20 1957  INR 1.0   Cardiac Enzymes: No results for input(s): CKTOTAL, CKMB, CKMBINDEX, TROPONINI in the last 168 hours. BNP (last 3 results) No results for input(s): PROBNP in the last 8760 hours. HbA1C: No results for input(s): HGBA1C in the last 72 hours. CBG: No results for input(s): GLUCAP in the last 168 hours. Lipid Profile: No results for input(s): CHOL, HDL, LDLCALC, TRIG, CHOLHDL, LDLDIRECT in the last 72 hours. Thyroid Function Tests: Recent Labs    05/12/20 1957  TSH 2.724   Anemia Panel: No results for input(s): VITAMINB12, FOLATE, FERRITIN, TIBC, IRON, RETICCTPCT in the last 72 hours. Sepsis Labs: No results for input(s): PROCALCITON, LATICACIDVEN in the last 168 hours.  Recent Results (from the past 240 hour(s))  SARS Coronavirus 2 by RT PCR (hospital order, performed in [facility] hospital lab) Nasopharyngeal Nasopharyngeal Swab     Status: None   Collection Time: 05/12/20  6:25 PM   Specimen: Nasopharyngeal Swab  Result Value Ref Range Status   SARS Coronavirus 2 NEGATIVE NEGATIVE Final    Comment: (NOTE) SARS-CoV-2 target nucleic acids are NOT DETECTED.  The SARS-CoV-2 RNA is generally detectable in upper and lower respiratory specimens during the acute phase of infection. The lowest concentration of SARS-CoV-2 viral copies this assay can detect is 250 copies /  mL. A negative result does not preclude SARS-CoV-2 infection and should not be used as the sole basis for treatment or other patient management decisions.  A negative result may occur with improper specimen collection / handling, submission of specimen other than nasopharyngeal swab, presence of viral mutation(s) within the areas targeted by this assay, and inadequate number of viral copies (<250 copies / mL). A negative result must be combined with clinical observations, patient history, and epidemiological information.  Fact Sheet for Patients:   BoilerBrush.com.cy  Fact Sheet for Healthcare Providers: https://pope.com/  This test is not yet approved or  cleared by the [place] FDA and has been authorized for detection and/or diagnosis of SARS-CoV-2 by FDA under an Emergency Use Authorization (EUA).  This EUA will remain in effect (meaning this test can be used) for the duration of the COVID-19 declaration under Section 564(b)(1) of the Act, 21 U.S.C. section 360bbb-3(b)(1), unless the authorization is terminated or revoked sooner.  Performed at [facility], [address], [place], [place] [address]  Radiology Studies: DG Chest 1 View  Result Date: 05/12/2020 CLINICAL DATA:  Fall EXAM: CHEST  1 VIEW COMPARISON:  None. FINDINGS: Heart and mediastinal contours are within normal limits. No focal opacities or effusions. No acute bony abnormality. Aortic atherosclerosis. No pneumothorax. IMPRESSION: No active disease. Electronically Signed   By: Charlett Nose M.D.   On: 05/12/2020 18:14   DG Pelvis 1-2 Views  Result Date: 05/12/2020 CLINICAL DATA:  Fall, right hip pain, leg pain EXAM: PELVIS - 1-2 VIEW COMPARISON:  None. FINDINGS: There is a right femoral intertrochanteric fracture with varus angulation. No subluxation or dislocation. Mild symmetric degenerative changes in the hips. IMPRESSION: Right femoral  intertrochanteric fracture with varus angulation. Electronically Signed   By: Charlett Nose M.D.   On: 05/12/2020 18:13   DG Knee 1-2 Views Right  Result Date: 05/12/2020 CLINICAL DATA:  Fall. EXAM: RIGHT KNEE - 1-2 VIEW COMPARISON:  None. FINDINGS: No acute fracture or dislocation. Trace suprapatellar joint effusion. Moderate medial and lateral compartment joint space narrowing. Small tricompartmental marginal osteophytes. Osteopenia. Soft tissues are unremarkable. Vascular calcifications. IMPRESSION: 1. No acute osseous abnormality. 2. Tricompartmental osteoarthritis. Electronically Signed   By: Obie Dredge M.D.   On: 05/12/2020 16:36   DG Femur Min 2 Views Right  Result Date: 05/12/2020 CLINICAL DATA:  Fall.  Right hip pain EXAM: RIGHT FEMUR 2 VIEWS COMPARISON:  None. FINDINGS: There is a right femoral intertrochanteric fracture with varus angulation. No subluxation or dislocation. No additional acute femoral abnormality. No joint effusion within the right knee. IMPRESSION: Right femoral intertrochanteric fracture with varus angulation. Electronically Signed   By: Charlett Nose M.D.   On: 05/12/2020 18:12    Scheduled Meds: . [MAR Hold] amLODipine  10 mg Oral Daily  . [MAR Hold] carvedilol  3.125 mg Oral BID  . [MAR Hold] levothyroxine  125 mcg Oral Daily  . [MAR Hold] lisinopril  20 mg Oral Daily  . [MAR Hold] pantoprazole  40 mg Oral Daily  . [MAR Hold] pravastatin  20 mg Oral Daily   Continuous Infusions: . [MAR Hold]  ceFAZolin (ANCEF) IV       LOS: 1 day   Time spent: 40 minutes.  Arnetha Courser, MD [facility]  If 7PM-7AM, please contact night-coverage [email or web address]  05/13/2020, 4:34 PM   This record has been created using Conservation officer, historic buildings. Errors have been sought and corrected,but may not always be located. Such creation errors do not reflect on the standard of care.

## 2020-05-13 NOTE — Progress Notes (Signed)
Gave report to Same Day surgery Nurse Lizzy at this time. Expected time of OR procedure today is 2:45 pm. Son is at the bedside. Will sign consent with son.

## 2020-05-13 NOTE — Anesthesia Preprocedure Evaluation (Addendum)
Anesthesia Evaluation  Patient identified by MRN, date of birth, ID band Patient awake    Reviewed: Allergy & Precautions, H&P , NPO status , Patient's Chart, lab work & pertinent test results  History of Anesthesia Complications Negative for: history of anesthetic complications  Airway Mallampati: II  TM Distance: >3 FB Neck ROM: full    Dental  (+) Edentulous Upper, Edentulous Lower   Pulmonary Current Smoker,    Pulmonary exam normal        Cardiovascular hypertension, (-) angina+ CAD and + Cardiac Stents (10 years ago)  (-) CHF Normal cardiovascular exam(-) dysrhythmias      Neuro/Psych Chronic back pain, takes 3-4 oxycodone 10 mg per day. Does report some intermittent numbness/tingling/weakness of legs, not very well described.  Ambulates on her own.   negative psych ROS   GI/Hepatic negative GI ROS, Neg liver ROS,   Endo/Other  Hypothyroidism   Renal/GU      Musculoskeletal   Abdominal   Peds  Hematology negative hematology ROS (+)   Anesthesia Other Findings Past Medical History: No date: CAD (coronary artery disease) No date: Chronic back pain No date: Hyperlipidemia No date: Hypertension No date: Hypothyroidism No date: Tobacco use  Past Surgical History: 03/02/2016: LAPAROSCOPIC CHOLECYSTECTOMY  BMI    Body Mass Index: 19.69 kg/m      Reproductive/Obstetrics negative OB ROS                          Anesthesia Physical Anesthesia Plan  ASA: II  Anesthesia Plan: Spinal   Post-op Pain Management:    Induction:   PONV Risk Score and Plan: Propofol infusion  Airway Management Planned: Natural Airway and Simple Face Mask  Additional Equipment:   Intra-op Plan:   Post-operative Plan:   Informed Consent: I have reviewed the patients History and Physical, chart, labs and discussed the procedure including the risks, benefits and alternatives for the proposed  anesthesia with the patient or authorized representative who has indicated his/her understanding and acceptance.     Dental Advisory Given  Plan Discussed with: Anesthesiologist  Anesthesia Plan Comments: (Risks of spinal discussed including risk of worsening existing nerve injury.  Risks/benefits of spinal vs GETA discussed and all questions answered.  Plan to proceed with spinal.  KR)       Anesthesia Quick Evaluation

## 2020-05-13 NOTE — Consult Note (Signed)
Reason for Consult: Right intertrochanteric hip fracture Referring Physician: Dr. Kevan Ny GERARD Rebecca Peck is an 74 y.o. female.  HPI: Patient is a 74 year old who is household ambulator with a cane who has had trouble with being unsteady with her gait and suffered a fall.  She denies loss of consciousness.  She came to the emergency room and had a great deal of knee pain subsequently found to have a hip fracture she is admitted for treatment of this her son reports she does have suffer from some confusion as well.  Past Medical History:  Diagnosis Date  . CAD (coronary artery disease)   . Chronic back pain   . Hyperlipidemia   . Hypertension   . Hypothyroidism   . Tobacco use     Past Surgical History:  Procedure Laterality Date  . LAPAROSCOPIC CHOLECYSTECTOMY  03/02/2016    History reviewed. No pertinent family history.  Social History:  reports that she has been smoking. She has been smoking about 0.50 packs per day. She has never used smokeless tobacco. She reports previous alcohol use. No history on file for drug use.  Allergies:  Allergies  Allergen Reactions  . Sulfa Antibiotics Rash    Medications: I have reviewed the patient's current medications.  Results for orders placed or performed during the hospital encounter of 05/12/20 (from the past 48 hour(s))  SARS Coronavirus 2 by RT PCR (hospital order, performed in [facility] hospital lab) Nasopharyngeal Nasopharyngeal Swab     Status: None   Collection Time: 05/12/20  6:25 PM   Specimen: Nasopharyngeal Swab  Result Value Ref Range   SARS Coronavirus 2 NEGATIVE NEGATIVE    Comment: (NOTE) SARS-CoV-2 target nucleic acids are NOT DETECTED.  The SARS-CoV-2 RNA is generally detectable in upper and lower respiratory specimens during the acute phase of infection. The lowest concentration of SARS-CoV-2 viral copies this assay can detect is 250 copies / mL. A negative result does not preclude SARS-CoV-2 infection and  should not be used as the sole basis for treatment or other patient management decisions.  A negative result may occur with improper specimen collection / handling, submission of specimen other than nasopharyngeal swab, presence of viral mutation(s) within the areas targeted by this assay, and inadequate number of viral copies (<250 copies / mL). A negative result must be combined with clinical observations, patient history, and epidemiological information.  Fact Sheet for Patients:   BoilerBrush.com.cy  Fact Sheet for Healthcare Providers: https://pope.com/  This test is not yet approved or  cleared by the [place] FDA and has been authorized for detection and/or diagnosis of SARS-CoV-2 by FDA under an Emergency Use Authorization (EUA).  This EUA will remain in effect (meaning this test can be used) for the duration of the COVID-19 declaration under Section 564(b)(1) of the Act, 21 U.S.C. section 360bbb-3(b)(1), unless the authorization is terminated or revoked sooner.  Performed at [facility], [address] [place], [place] [address]   CBC     Status: Abnormal   Collection Time: 05/12/20  7:57 PM  Result Value Ref Range   WBC 19.3 (H) 4.0 - 10.5 K/uL   RBC 3.93 3.87 - 5.11 MIL/uL   Hemoglobin 12.0 12.0 - 15.0 g/dL   HCT 37.6 (L) 36 - 46 %   MCV 83.2 80.0 - 100.0 fL   MCH 30.5 26.0 - 34.0 pg   MCHC 36.7 (H) 30.0 - 36.0 g/dL   RDW 28.3 15.1 - 76.1 %   Platelets  284 150 - 400 K/uL   nRBC 0.0 0.0 - 0.2 %    Comment: Performed at [facility], [address] [place], [place] [address]  Protime-INR     Status: None   Collection Time: 05/12/20  7:57 PM  Result Value Ref Range   Prothrombin Time 12.8 11.4 - 15.2 seconds   INR 1.0 0.8 - 1.2    Comment: (NOTE) INR goal varies based on device and disease states. Performed at [facility], [address] [place], [place] [address]   Basic  metabolic panel     Status: Abnormal   Collection Time: 05/12/20  7:57 PM  Result Value Ref Range   Sodium 122 (L) 135 - 145 mmol/L   Potassium 3.3 (L) 3.5 - 5.1 mmol/L   Chloride 90 (L) 98 - 111 mmol/L   CO2 25 22 - 32 mmol/L   Glucose, Bld 118 (H) 70 - 99 mg/dL    Comment: Glucose reference range applies only to samples taken after fasting for at least 8 hours.   BUN 10 8 - 23 mg/dL   Creatinine, Ser 7.34 0.44 - 1.00 mg/dL   Calcium 8.7 (L) 8.9 - 10.3 mg/dL   GFR calc non Af Amer >60 >60 mL/min   GFR calc Af Amer >60 >60 mL/min   Anion gap 7 5 - 15    Comment: Performed at [facility], [address] [place], [place] [address]  Type and screen [facility]     Status: None   Collection Time: 05/12/20  7:57 PM  Result Value Ref Range   ABO/RH(D) A POS    Antibody Screen NEG    Sample Expiration      05/15/2020,2359 Performed at [facility], [address] [place], [place] [address]   Osmolality     Status: Abnormal   Collection Time: 05/12/20  7:57 PM  Result Value Ref Range   Osmolality 260 (L) 275 - 295 mOsm/kg    Comment: Performed at [facility], [address], [place], [place] [address]  Magnesium     Status: None   Collection Time: 05/12/20  7:57 PM  Result Value Ref Range   Magnesium 1.9 1.7 - 2.4 mg/dL    Comment: Performed at [facility], [address] [place], [place] [address]  TSH     Status: None   Collection Time: 05/12/20  7:57 PM  Result Value Ref Range   TSH 2.724 0.350 - 4.500 uIU/mL    Comment: Performed by a 3rd Generation assay with a functional sensitivity of <=0.01 uIU/mL. Performed at [facility] [place], [address] [place], [place] [address]   Sodium, urine, random     Status: None   Collection Time: 05/12/20 11:20 PM  Result Value Ref Range   Sodium, Ur 35 mmol/L    Comment: Performed at [facility], [address] [place], [place] [address]  Osmolality,  urine     Status: None   Collection Time: 05/12/20 11:20 PM  Result Value Ref Range   Osmolality, Ur 471 300 - 900 mOsm/kg    Comment: Performed at [facility], [address] [place], [place] [address]  Urinalysis, Routine w reflex microscopic     Status: Abnormal   Collection Time: 05/12/20 11:20 PM  Result Value Ref Range   Color, Urine YELLOW (A) YELLOW   APPearance CLEAR (A) CLEAR   Specific Gravity, Urine 1.023 1.005 - 1.030  pH 5.0 5.0 - 8.0   Glucose, UA NEGATIVE NEGATIVE mg/dL   Hgb urine dipstick NEGATIVE NEGATIVE   Bilirubin Urine NEGATIVE NEGATIVE   Ketones, ur NEGATIVE NEGATIVE mg/dL   Protein, ur 30 (A) NEGATIVE mg/dL   Nitrite NEGATIVE NEGATIVE   Leukocytes,Ua NEGATIVE NEGATIVE   WBC, UA 0-5 0 - 5 WBC/hpf   Bacteria, UA NONE SEEN NONE SEEN   Squamous Epithelial / LPF NONE SEEN 0 - 5   Budding Yeast PRESENT     Comment: Performed at [facility], [address] [place], [place] [address]  CBC     Status: Abnormal   Collection Time: 05/13/20  4:27 AM  Result Value Ref Range   WBC 12.5 (H) 4.0 - 10.5 K/uL   RBC 3.73 (L) 3.87 - 5.11 MIL/uL   Hemoglobin 11.0 (L) 12.0 - 15.0 g/dL   HCT 60.432.0 (L) 36 - 46 %   MCV 85.8 80.0 - 100.0 fL   MCH 29.5 26.0 - 34.0 pg   MCHC 34.4 30.0 - 36.0 g/dL   RDW 54.014.3 98.111.5 - 19.115.5 %   Platelets 242 150 - 400 K/uL   nRBC 0.0 0.0 - 0.2 %    Comment: Performed at [facility], [address] [place], [place] [address]  Basic metabolic panel     Status: Abnormal   Collection Time: 05/13/20  4:27 AM  Result Value Ref Range   Sodium 125 (L) 135 - 145 mmol/L   Potassium 4.2 3.5 - 5.1 mmol/L   Chloride 94 (L) 98 - 111 mmol/L   CO2 23 22 - 32 mmol/L   Glucose, Bld 100 (H) 70 - 99 mg/dL    Comment: Glucose reference range applies only to samples taken after fasting for at least 8 hours.   BUN 9 8 - 23 mg/dL   Creatinine, Ser 5.620.67 0.44 - 1.00 mg/dL   Calcium 8.5 (L) 8.9 - 10.3 mg/dL   GFR calc non Af Amer  >60 >60 mL/min   GFR calc Af Amer >60 >60 mL/min   Anion gap 8 5 - 15    Comment: Performed at [facility], [address] [place], [place] [address]    DG Chest 1 View  Result Date: 05/12/2020 CLINICAL DATA:  Fall EXAM: CHEST  1 VIEW COMPARISON:  None. FINDINGS: Heart and mediastinal contours are within normal limits. No focal opacities or effusions. No acute bony abnormality. Aortic atherosclerosis. No pneumothorax. IMPRESSION: No active disease. Electronically Signed   By: Charlett NoseKevin  Dover M.D.   On: 05/12/2020 18:14   DG Pelvis 1-2 Views  Result Date: 05/12/2020 CLINICAL DATA:  Fall, right hip pain, leg pain EXAM: PELVIS - 1-2 VIEW COMPARISON:  None. FINDINGS: There is a right femoral intertrochanteric fracture with varus angulation. No subluxation or dislocation. Mild symmetric degenerative changes in the hips. IMPRESSION: Right femoral intertrochanteric fracture with varus angulation. Electronically Signed   By: Charlett NoseKevin  Dover M.D.   On: 05/12/2020 18:13   DG Knee 1-2 Views Right  Result Date: 05/12/2020 CLINICAL DATA:  Fall. EXAM: RIGHT KNEE - 1-2 VIEW COMPARISON:  None. FINDINGS: No acute fracture or dislocation. Trace suprapatellar joint effusion. Moderate medial and lateral compartment joint space narrowing. Small tricompartmental marginal osteophytes. Osteopenia. Soft tissues are unremarkable. Vascular calcifications. IMPRESSION: 1. No acute osseous abnormality. 2. Tricompartmental osteoarthritis. Electronically Signed   By: Obie DredgeWilliam T Derry M.D.   On: 05/12/2020 16:36   DG Femur Min 2 Views Right  Result Date: 05/12/2020 CLINICAL  DATA:  Fall.  Right hip pain EXAM: RIGHT FEMUR 2 VIEWS COMPARISON:  None. FINDINGS: There is a right femoral intertrochanteric fracture with varus angulation. No subluxation or dislocation. No additional acute femoral abnormality. No joint effusion within the right knee. IMPRESSION: Right femoral intertrochanteric fracture with varus angulation.  Electronically Signed   By: Charlett Nose M.D.   On: 05/12/2020 18:12    Review of Systems Blood pressure (!) 160/85, pulse 85, temperature 98.5 F (36.9 C), temperature source Oral, resp. rate 17, height 5\' 6"  (1.676 m), weight 55.3 kg, SpO2 96 %. Physical Exam right leg is shortened and externally rotated with trace dorsalis pedis pulse.  Sensation intact.  She able flex extend the toes.  Skin around the hip is normal  Assessment/Plan: Right high intertrochanteric hip fracture Plan is for open reduction internal fixation later today.  Discussed risks of surgery in particular with this higher level intertrochanteric fracture risk of nonunion or avascular necrosis with her son.  05/13/2020, 7:57 AM

## 2020-05-14 ENCOUNTER — Encounter: Payer: Self-pay | Admitting: Orthopedic Surgery

## 2020-05-14 ENCOUNTER — Inpatient Hospital Stay: Payer: Medicare HMO

## 2020-05-14 LAB — BASIC METABOLIC PANEL
Anion gap: 7 (ref 5–15)
BUN: 9 mg/dL (ref 8–23)
CO2: 25 mmol/L (ref 22–32)
Calcium: 8.1 mg/dL — ABNORMAL LOW (ref 8.9–10.3)
Chloride: 95 mmol/L — ABNORMAL LOW (ref 98–111)
Creatinine, Ser: 0.57 mg/dL (ref 0.44–1.00)
GFR calc Af Amer: >60 mL/min (ref >60)
GFR calc non Af Amer: >60 mL/min (ref >60)
Glucose, Bld: 103 mg/dL — ABNORMAL HIGH (ref 70–99)
Potassium: 3.5 mmol/L (ref 3.5–5.1)
Sodium: 127 mmol/L — ABNORMAL LOW (ref 135–145)

## 2020-05-14 LAB — CBC
HCT: 27 % — ABNORMAL LOW (ref 36.0–46.0)
Hemoglobin: 9.4 g/dL — ABNORMAL LOW (ref 12.0–15.0)
MCH: 30.3 pg (ref 26.0–34.0)
MCHC: 34.8 g/dL (ref 30.0–36.0)
MCV: 87.1 fL (ref 80.0–100.0)
Platelets: 195 10*3/uL (ref 150–400)
RBC: 3.1 MIL/uL — ABNORMAL LOW (ref 3.87–5.11)
RDW: 14.3 % (ref 11.5–15.5)
WBC: 10.4 10*3/uL (ref 4.0–10.5)
nRBC: 0 % (ref 0.0–0.2)

## 2020-05-14 MED ORDER — OXYCODONE HCL 10 MG PO TABS: 10.0000 mg | ORAL_TABLET | Freq: Four times a day (QID) | ORAL | 0 refills | Status: DC | PRN

## 2020-05-14 MED ORDER — ENOXAPARIN SODIUM 40 MG/0.4ML ~~LOC~~ SOLN: 40.0000 mg | SUBCUTANEOUS | 0 refills | Status: AC

## 2020-05-14 MED ORDER — OXYCODONE HCL 5 MG PO TABS
10.0000 mg | ORAL_TABLET | ORAL | Status: DC | PRN
  Administered 2020-05-14 – 2020-05-27 (×30): 10 mg via ORAL
  Filled 2020-05-14 (×34): qty 2

## 2020-05-14 MED ORDER — OXYCODONE HCL 5 MG PO TABS: 5.0000 mg | ORAL_TABLET | ORAL | 0 refills | Status: DC | PRN

## 2020-05-14 MED ORDER — POLYETHYLENE GLYCOL 3350 17 G PO PACK
17.0000 g | PACK | Freq: Every day | ORAL | Status: DC
  Administered 2020-05-15 – 2020-05-27 (×11): 17 g via ORAL
  Filled 2020-05-14 (×14): qty 1

## 2020-05-14 MED ORDER — FE FUMARATE-B12-VIT C-FA-IFC PO CAPS
1.0000 | ORAL_CAPSULE | Freq: Two times a day (BID) | ORAL | Status: DC
  Administered 2020-05-14 – 2020-05-27 (×25): 1 via ORAL
  Filled 2020-05-14 (×28): qty 1

## 2020-05-14 MED ORDER — CYCLOBENZAPRINE HCL 10 MG PO TABS
10.0000 mg | ORAL_TABLET | Freq: Three times a day (TID) | ORAL | Status: DC
  Administered 2020-05-14 – 2020-05-18 (×13): 10 mg via ORAL
  Filled 2020-05-14 (×13): qty 1

## 2020-05-14 MED ORDER — OXYCODONE HCL 5 MG PO TABS
10.0000 mg | ORAL_TABLET | Freq: Every day | ORAL | Status: DC
  Administered 2020-05-14 – 2020-05-19 (×20): 10 mg via ORAL
  Filled 2020-05-14 (×21): qty 2

## 2020-05-14 NOTE — Progress Notes (Signed)
Paged PA x 3 regarding Pt's family requests. Awaiting response

## 2020-05-14 NOTE — Evaluation (Signed)
Physical Therapy Evaluation Patient Details Name: Rebecca Peck MRN: [id number] DOB: 12-Apr-1946 Today's Date: 05/14/2020   History of Present Illness  74 year old who is household ambulator with a cane who has had trouble with being unsteady with her gait and suffered a fall and subsequent R hip fracture with ORIF IM nailing 6/30.  Clinical Impression  Pt having a lot of pain, attempted to see earlier in the day and instead organized pain meds with nursing and saw later in the AM.  Ultimately she was very limited with mobility, strength and general function.  She struggled to put any real weight on the R during transfer from bed to recliner despite cuing and assurances that WBAT was appropriate.  She could not show good quad set and was completely unable to do any AROM with SAQ, could not hold leg up with PROM assist to knee extension in this position.  Pt was not overly active before fall/fracture but even with 24/7 assist would be unsafe and struggle significantly trying to be home, will need STR at d/c to work back toward safe transition to home.      Follow Up Recommendations SNF;Supervision/Assistance - 24 hour    Equipment Recommendations  3in1 (PT)    Recommendations for Other Services       Precautions / Restrictions Precautions Precautions: Fall Restrictions Weight Bearing Restrictions: Yes      Mobility  Bed Mobility Overal bed mobility: Needs Assistance Bed Mobility: Supine to Sit     Supine to sit: Mod assist;+2 for physical assistance     General bed mobility comments: Pt struggled to give much assist during transition, able to scoot hips minimally to EOB with cuing  Transfers Overall transfer level: Needs assistance Equipment used: Rolling walker (2 wheeled);2 person hand held assist Transfers: Sit to/from Stand Sit to Stand: Max assist;Mod assist         General transfer comment: Pt showed good effort in getting to standing, needed assist physically as  well as much cuing for sequencing, set up, UE use, etc  Ambulation/Gait             General Gait Details: Pt managed a few very hesitancy, labored, assisted shuffle steps toward EOB but ultimately hardly put any weight on the R and needed heavy +2 assist   Stairs            Wheelchair Mobility    Modified Rankin (Stroke Patients Only)       Balance Overall balance assessment: Modified Independent                                           Pertinent Vitals/Pain Pain Assessment: 0-10 Pain Score: 10-Worst pain ever Pain Location: R hip and thigh Pain Intervention(s): Premedicated before session;Limited activity within patient's tolerance    Home Living Family/patient expects to be discharged to:: Private residence Living Arrangements: Spouse/significant other;Children Available Help at Discharge: Family;Available 24 hours/day (daughter lives with them, around 24/7 if needed) Type of Home: House Home Access: Stairs to enter Entrance Stairs-Rails: Right Entrance Stairs-Number of Steps: 3+3   Home Equipment: Environmental consultant - 2 wheels;Bedside commode (apparently the [facility] is broken, can only be used over toilet)      Prior Function Level of Independence: Independent         Comments: Pt apparently has minimal ambulation tolerance (<25 ft) and having more falls recently  and has been unsteady for a while, rarely out of the home except MD appointments     Hand Dominance        Extremity/Trunk Assessment   Upper Extremity Assessment Upper Extremity Assessment: Generalized weakness    Lower Extremity Assessment Lower Extremity Assessment:  (R hip and knee with little to AROM, poor PROM tolerance)       Communication   Communication: No difficulties  Cognition Arousal/Alertness: Awake/alert Behavior During Therapy: WFL for tasks assessed/performed Overall Cognitive Status: Within Functional Limits for tasks assessed                                         General Comments      Exercises General Exercises - Lower Extremity Ankle Circles/Pumps: AROM;10 reps Quad Sets: AROM;AAROM;10 reps Short Arc Quad: PROM;10 reps (unable to initiate any against gravity motion with quad) Heel Slides: AAROM;5 reps (lightly resisted leg extensions) Hip ABduction/ADduction: AAROM;10 reps (very pain limited on R)   Assessment/Plan    PT Assessment Patient needs continued PT services  PT Problem List Decreased strength;Decreased range of motion;Decreased activity tolerance;Decreased balance;Decreased mobility;Decreased coordination;Decreased cognition;Decreased knowledge of use of DME;Decreased safety awareness;Pain       PT Treatment Interventions DME instruction;Stair training;Gait training;Functional mobility training;Therapeutic activities;Therapeutic exercise;Balance training;Neuromuscular re-education;Cognitive remediation;Patient/family education    PT Goals (Current goals can be found in the Care Plan section)  Acute Rehab PT Goals Patient Stated Goal: get back home after rehab PT Goal Formulation: With patient Time For Goal Achievement: 05/28/20 Potential to Achieve Goals: Fair    Frequency BID   Barriers to discharge        Co-evaluation               AM-PAC PT "6 Clicks" Mobility  Outcome Measure Help needed turning from your back to your side while in a flat bed without using bedrails?: A Lot Help needed moving from lying on your back to sitting on the side of a flat bed without using bedrails?: Total Help needed moving to and from a bed to a chair (including a wheelchair)?: Total Help needed standing up from a chair using your arms (e.g., wheelchair or bedside chair)?: Total Help needed to walk in hospital room?: Total Help needed climbing 3-5 steps with a railing? : Total 6 Click Score: 7    End of Session Equipment Utilized During Treatment: Gait belt Activity Tolerance: Patient limited by  pain Patient left: with chair alarm set;with call bell/phone within reach Nurse Communication: Mobility status PT Visit Diagnosis: Muscle weakness (generalized) (M62.81);Difficulty in walking, not elsewhere classified (R26.2)    Time: 5701-7793 PT Time Calculation (min) (ACUTE ONLY): 42 min   Charges:   PT Evaluation $PT Eval Low Complexity: 1 Low PT Treatments $Therapeutic Exercise: 8-22 mins $Therapeutic Activity: 8-22 mins        Malachi Pro, DPT 05/14/2020, 1:13 PM

## 2020-05-14 NOTE — Progress Notes (Signed)
PROGRESS NOTE    AVAMAE DEHAAN  PIR:[id number]  DOB: August 23, 1946  DOA: 05/12/2020 PCP: Patient, No Pcp Per Outpatient Specialists:   Hospital course:  Melyna Huron Pickardis a 74 y.o.femalewith medical history significant forCADwith remote stent placement, hypertension, hyperlipidemia, hypothyroidism, GERD,tobacco use,and chronic back pain who was admitted 05/12/2020 with right hip fracture status post mechanical fall.   Subjective:  Patient has no complaints at present.  Patient's son is very concerned that she has not been getting adequate pain medications.  He notes that she has not been getting her oxycodone even though they have asked for it.   Objective: Vitals:   05/14/20 0353 05/14/20 0723 05/14/20 1154 05/14/20 1615  BP: 120/61 (!) 142/71 (!) 164/73 (!) 152/75  Pulse: 81 88 90 88  Resp: 16 16 16 18   Temp: 98.2 F (36.8 C) 98.3 F (36.8 C) 98.2 F (36.8 C) 99.7 F (37.6 C)  TempSrc: Oral Oral Oral Oral  SpO2: 92% 95% 95% 92%  Weight:      Height:        Intake/Output Summary (Last 24 hours) at 05/14/2020 1705 Last data filed at 05/14/2020 1600 Gross per 24 hour  Intake 1843 ml  Output 650 ml  Net 1193 ml   Filed Weights   05/12/20 1551 05/13/20 1420  Weight: 55.3 kg 55.3 kg     Exam:  General: Tired appearing female lying in bed somewhat listless laying Eyes: sclera anicteric, conjuctiva mild injection bilaterally CVS: S1-S2, regular  Respiratory:  decreased air entry bilaterally secondary to decreased inspiratory effort, rales at bases  GI: NABS, soft, NT  LE: No edema.  Neuro: grossly nonfocal.    Assessment & Plan:    Right hip fracture.   Secondary to mechanical fall. POD #1 status post intramedullary femoral nail on right. PT OT per hip fracture protocol  History of CAD.   S/p PCI many years ago.  No chest pain. Continue carvedilol and pravastatin.  Chronic hyponatremia.   Patient is followed at [facility] and seems to have  chronic hyponatremia with sodiums around 125. Sodium here was somewhat lower on admission however this is improved to her baseline with IV fluid resuscitation.  Hypertension.   Under reasonable control on home doses of carvedilol, amlodipine, and lisinopril  Hyperlipidemia: Continue pravastatin  Hypothyroidism: Continue Synthroid.  Chronic back pain: Continue home oxycodone in addition to the as needed pain medication she is getting for her hip fracture.3   DVT prophylaxis: SCD, Lovenox to be restarted per orthopedics Code Status: Full Family Communication: Son was at bedside throughout Disposition Plan:   Patient is from: Home  Anticipated Discharge Location: Rehab  Barriers to Discharge: Status post ORIF yesterday  Is patient medically stable for Discharge: Not yet   Consultants:  Orthopedics  Procedures:  Status post ORIF 05/13/2020  Antimicrobials:  None   Data Reviewed:  Basic Metabolic Panel: Recent Labs  Lab 05/12/20 1957 05/13/20 0427 05/14/20 0426  NA 122* 125* 127*  K 3.3* 4.2 3.5  CL 90* 94* 95*  CO2 25 23 25   GLUCOSE 118* 100* 103*  BUN 10 9 9   CREATININE 0.72 0.67 0.57  CALCIUM 8.7* 8.5* 8.1*  MG 1.9  --   --    Liver Function Tests: No results for input(s): AST, ALT, ALKPHOS, BILITOT, PROT, ALBUMIN in the last 168 hours. No results for input(s): LIPASE, AMYLASE in the last 168 hours. No results for input(s): AMMONIA in the last 168 hours. CBC: Recent Labs  Lab 05/12/20 1957 05/13/20 0427 05/14/20 0426  WBC 19.3* 12.5* 10.4  HGB 12.0 11.0* 9.4*  HCT 32.7* 32.0* 27.0*  MCV 83.2 85.8 87.1  PLT 284 242 195   Cardiac Enzymes: No results for input(s): CKTOTAL, CKMB, CKMBINDEX, TROPONINI in the last 168 hours. BNP (last 3 results) No results for input(s): PROBNP in the last 8760 hours. CBG: No results for input(s): GLUCAP in the last 168 hours.  Recent Results (from the past 240 hour(s))  SARS Coronavirus 2 by RT PCR (hospital  order, performed in [facility] hospital lab) Nasopharyngeal Nasopharyngeal Swab     Status: None   Collection Time: 05/12/20  6:25 PM   Specimen: Nasopharyngeal Swab  Result Value Ref Range Status   SARS Coronavirus 2 NEGATIVE NEGATIVE Final    Comment: (NOTE) SARS-CoV-2 target nucleic acids are NOT DETECTED.  The SARS-CoV-2 RNA is generally detectable in upper and lower respiratory specimens during the acute phase of infection. The lowest concentration of SARS-CoV-2 viral copies this assay can detect is 250 copies / mL. A negative result does not preclude SARS-CoV-2 infection and should not be used as the sole basis for treatment or other patient management decisions.  A negative result may occur with improper specimen collection / handling, submission of specimen other than nasopharyngeal swab, presence of viral mutation(s) within the areas targeted by this assay, and inadequate number of viral copies (<250 copies / mL). A negative result must be combined with clinical observations, patient history, and epidemiological information.  Fact Sheet for Patients:   BoilerBrush.com.cy  Fact Sheet for Healthcare Providers: https://pope.com/  This test is not yet approved or  cleared by the [place] FDA and has been authorized for detection and/or diagnosis of SARS-CoV-2 by FDA under an Emergency Use Authorization (EUA).  This EUA will remain in effect (meaning this test can be used) for the duration of the COVID-19 declaration under Section 564(b)(1) of the Act, 21 U.S.C. section 360bbb-3(b)(1), unless the authorization is terminated or revoked sooner.  Performed at [facility], [address] [place], [place] [address]       Studies: DG Chest 1 View  Result Date: 05/12/2020 CLINICAL DATA:  Fall EXAM: CHEST  1 VIEW COMPARISON:  None. FINDINGS: Heart and mediastinal contours are within normal limits. No focal  opacities or effusions. No acute bony abnormality. Aortic atherosclerosis. No pneumothorax. IMPRESSION: No active disease. Electronically Signed   By: Charlett Nose M.D.   On: 05/12/2020 18:14   DG Pelvis 1-2 Views  Result Date: 05/12/2020 CLINICAL DATA:  Fall, right hip pain, leg pain EXAM: PELVIS - 1-2 VIEW COMPARISON:  None. FINDINGS: There is a right femoral intertrochanteric fracture with varus angulation. No subluxation or dislocation. Mild symmetric degenerative changes in the hips. IMPRESSION: Right femoral intertrochanteric fracture with varus angulation. Electronically Signed   By: Charlett Nose M.D.   On: 05/12/2020 18:13   DG Ankle Right Port  Result Date: 05/14/2020 CLINICAL DATA:  Right ankle pain and swelling.  Prior surgery. EXAM: PORTABLE RIGHT ANKLE - 2 VIEW COMPARISON:  No recent. FINDINGS: Diffuse soft tissue swelling. No acute bony or joint abnormality identified. No evidence of fracture or dislocation. IMPRESSION: Diffuse soft tissue swelling. No acute bony or joint abnormality identified. No evidence of fracture or dislocation. Electronically Signed   By: Maisie Fus  Register   On: 05/14/2020 13:33   DG HIP OPERATIVE UNILAT W OR W/O PELVIS RIGHT  Result Date: 05/13/2020 CLINICAL DATA:  Surgery EXAM: OPERATIVE right  HIP (WITH PELVIS IF PERFORMED) 8 VIEWS TECHNIQUE: Fluoroscopic spot image(s) were submitted for interpretation post-operatively. COMPARISON:  05/12/2020 FINDINGS: Multiple intraoperative spot images demonstrate internal fixation across the right proximal femoral fracture. Anatomic alignment. No hardware complicating feature. IMPRESSION: Internal fixation across the right intertrochanteric fracture. No complicating feature. Electronically Signed   By: Charlett Nose M.D.   On: 05/13/2020 16:43   DG Femur Min 2 Views Right  Result Date: 05/12/2020 CLINICAL DATA:  Fall.  Right hip pain EXAM: RIGHT FEMUR 2 VIEWS COMPARISON:  None. FINDINGS: There is a right femoral  intertrochanteric fracture with varus angulation. No subluxation or dislocation. No additional acute femoral abnormality. No joint effusion within the right knee. IMPRESSION: Right femoral intertrochanteric fracture with varus angulation. Electronically Signed   By: Charlett Nose M.D.   On: 05/12/2020 18:12     Scheduled Meds: . amLODipine  10 mg Oral Daily  . carvedilol  3.125 mg Oral BID  . Chlorhexidine Gluconate Cloth  6 each Topical Daily  . cyclobenzaprine  10 mg Oral TID  . docusate sodium  100 mg Oral BID  . enoxaparin (LOVENOX) injection  40 mg Subcutaneous Q24H  . ferrous fumarate-b12-vitamic C-folic acid  1 capsule Oral BID  . levothyroxine  125 mcg Oral Daily  . lisinopril  20 mg Oral Daily  . oxyCODONE  10 mg Oral 5 X Daily  . pantoprazole  40 mg Oral Daily  . polyethylene glycol  17 g Oral Daily  . pravastatin  20 mg Oral Daily   Continuous Infusions: . sodium chloride 75 mL/hr at 05/14/20 1600  .  ceFAZolin (ANCEF) IV      Principal Problem:   Intertrochanteric fracture of right femur, closed, initial encounter (HCC) Active Problems:   Hypothyroidism   Hypertension   Hyperlipidemia   Chronic back pain   CAD (coronary artery disease)   Tobacco use     Tesa Meadors Tublu Lyric Hoar, [facility]  If 7PM-7AM, please contact night-coverage [email or web address] Password [email or web address] 05/14/2020, 5:05 PM    LOS: 2 days

## 2020-05-14 NOTE — Progress Notes (Addendum)
   Subjective: 1 Day Post-Op Procedure(s) (LRB): INTRAMEDULLARY (IM) NAIL FEMORAL (Right) Patient reports pain as severe.   Patient is well, and has had no acute complaints or problems Denies any CP, SOB, ABD pain. We will start physical therapy today  Objective: Vital signs in last 24 hours: Temp:  [97.4 F (36.3 C)-99.6 F (37.6 C)] 98.3 F (36.8 C) (07/01 0723) Pulse Rate:  [81-98] 88 (07/01 0723) Resp:  [14-24] 16 (07/01 0723) BP: (120-176)/(61-99) 142/71 (07/01 0723) SpO2:  [92 %-100 %] 95 % (07/01 0723) Weight:  [55.3 kg] 55.3 kg (06/30 1420)  Intake/Output from previous day: 06/30 0701 - 07/01 0700 In: 1757.2 [P.O.:240; I.V.:1290; IV Piggyback:227.2] Out: 1250 [Urine:1250] Intake/Output this shift: No intake/output data recorded.  Recent Labs    05/12/20 1957 05/13/20 0427 05/14/20 0426  HGB 12.0 11.0* 9.4*   Recent Labs    05/13/20 0427 05/14/20 0426  WBC 12.5* 10.4  RBC 3.73* 3.10*  HCT 32.0* 27.0*  PLT 242 195   Recent Labs    05/13/20 0427 05/14/20 0426  NA 125* 127*  K 4.2 3.5  CL 94* 95*  CO2 23 25  BUN 9 9  CREATININE 0.67 0.57  GLUCOSE 100* 103*  CALCIUM 8.5* 8.1*   Recent Labs    05/12/20 1957  INR 1.0    EXAM General - Patient is Alert, Appropriate and Oriented Extremity - Neurovascular intact Sensation intact distally Intact pulses distally Dorsiflexion/Plantar flexion intact No cellulitis present Compartment soft Dressing - dressing C/D/I and no drainage Motor Function - intact, moving foot and toes well on exam.   Past Medical History:  Diagnosis Date  . CAD (coronary artery disease)   . Chronic back pain   . Hyperlipidemia   . Hypertension   . Hypothyroidism   . Tobacco use     Assessment/Plan:   1 Day Post-Op Procedure(s) (LRB): INTRAMEDULLARY (IM) NAIL FEMORAL (Right) Principal Problem:   Intertrochanteric fracture of right femur, closed, initial encounter (HCC) Active Problems:   Hypothyroidism    Hypertension   Hyperlipidemia   Chronic back pain   CAD (coronary artery disease)   Tobacco use  Estimated body mass index is 19.69 kg/m as calculated from the following:   Height as of this encounter: 5\' 6"  (1.676 m).   Weight as of this encounter: 55.3 kg. Advance diet Up with therapy  Work on bowel movement Acute postop blood loss anemia-hemoglobin 9.4.  Start iron supplement Pain moderate to severe.  Patient was on oxycodone 10 mg every 5 hours at home prior to fall and fracture.  Currently getting oxycodone 10 mg every 6 hours as needed.  We will change her regimen to every 4 hours as needed. Care management to assist with discharge  DVT Prophylaxis - Lovenox, TED hose and SCDs Weight-Bearing as tolerated to right leg   T. , PA-C [facility] 05/14/2020, 8:01 AM

## 2020-05-14 NOTE — Progress Notes (Signed)
Physical Therapy Treatment Patient Details Name: Rebecca Peck MRN: [id number] DOB: 1946/03/29 Today's Date: 05/14/2020    History of Present Illness 74 year old who is household ambulator with a cane who has had trouble with being unsteady with her gait and suffered a fall and subsequent R hip fracture with ORIF IM nailing 6/30.    PT Comments    Pt with very poor tolerance with all aspects of very modest PT session this afternoon.  Overall she showed little ability given even minimal AROM effort with exercises, tolerated only minimal range PROM at hip and ankle and frankly spent much of the session discussing with pt and son about pain meds (re: her normal regiment 2/2 baseline pain and spasms) and addressing realistic expectations post femur fracture and recovery process at rehab and then home.  Pt needing plenty of encouragment, reinforcement and breaks between reps/set to complete even very modest and highly assisted R LE exercises.  Pt swears she will be able to do more tomorrow if pain is better managed but ultimately was extremely pain limited this session and both she and son clearly frustrated with level of pain and related medication regiment; nursing notified.     Follow Up Recommendations  SNF;Supervision/Assistance - 24 hour     Equipment Recommendations  3in1 (PT)    Recommendations for Other Services       Precautions / Restrictions Precautions Precautions: Fall Restrictions Weight Bearing Restrictions: Yes RLE Weight Bearing: Weight bearing as tolerated    Mobility  Bed Mobility Overal bed mobility: Needs Assistance Bed Mobility: Supine to Sit     Supine to sit: Mod assist;+2 for physical assistance     General bed mobility comments: Pt back in bed (nursing assisted +2 after ~1 hr in recliner) deferred secondary to pain  Transfers Overall transfer level: Needs assistance Equipment used: Rolling walker (2 wheeled);2 person hand held assist Transfers: Sit  to/from Stand Sit to Stand: Max assist;Mod assist         General transfer comment: Pt showed good effort in getting to standing, needed assist physically as well as much cuing for sequencing, set up, UE use, etc  Ambulation/Gait             General Gait Details: Pt managed a few very hesitancy, labored, assisted shuffle steps toward EOB but ultimately hardly put any weight on the R and needed heavy +2 assist    Stairs             Wheelchair Mobility    Modified Rankin (Stroke Patients Only)       Balance Overall balance assessment: Modified Independent                                          Cognition Arousal/Alertness: Awake/alert Behavior During Therapy: Anxious Overall Cognitive Status: Within Functional Limits for tasks assessed                                        Exercises General Exercises - Lower Extremity Ankle Circles/Pumps: AAROM;10 reps (pt calling out in pain during AAROM ankle DF to neutral) Quad Sets: AROM;AAROM;10 reps Short Arc Quad: PROM;10 reps (pt unable to initiate any movement, calling out in pain) Heel Slides: AAROM;5 reps;PROM (pt struggles to tolerate any R LE movement this afternoon)  Hip ABduction/ADduction: AAROM;10 reps;PROM (pt very little to no ability to show effort with R hip Abd)    General Comments        Pertinent Vitals/Pain Pain Assessment: 0-10 Pain Score: 10-Worst pain ever Pain Location: R hip and thigh Pain Intervention(s): Premedicated before session    Home Living Family/patient expects to be discharged to:: Private residence Living Arrangements: Spouse/significant other;Children Available Help at Discharge: Family;Available 24 hours/day (daughter lives with them, around 24/7 if needed) Type of Home: House Home Access: Stairs to enter Entrance Stairs-Rails: Right   Home Equipment: Environmental consultant - 2 wheels;Bedside commode (apparently the [facility] is broken, can only be used over  toilet)      Prior Function Level of Independence: Independent      Comments: Pt apparently has minimal ambulation tolerance (<25 ft) and having more falls recently and has been unsteady for a while, rarely out of the home except MD appointments   PT Goals (current goals can now be found in the care plan section) Acute Rehab PT Goals Patient Stated Goal: get back home after rehab PT Goal Formulation: With patient Time For Goal Achievement: 05/28/20 Potential to Achieve Goals: Fair Progress towards PT goals: Not progressing toward goals - comment (pain)    Frequency    BID      PT Plan Current plan remains appropriate    Co-evaluation              AM-PAC PT "6 Clicks" Mobility   Outcome Measure  Help needed turning from your back to your side while in a flat bed without using bedrails?: A Lot Help needed moving from lying on your back to sitting on the side of a flat bed without using bedrails?: Total Help needed moving to and from a bed to a chair (including a wheelchair)?: Total Help needed standing up from a chair using your arms (e.g., wheelchair or bedside chair)?: Total Help needed to walk in hospital room?: Total Help needed climbing 3-5 steps with a railing? : Total 6 Click Score: 7    End of Session Equipment Utilized During Treatment: Gait belt Activity Tolerance: Patient limited by pain Patient left: with chair alarm set;with call bell/phone within reach Nurse Communication: Mobility status PT Visit Diagnosis: Muscle weakness (generalized) (M62.81);Difficulty in walking, not elsewhere classified (R26.2)     Time: 0981-1914 PT Time Calculation (min) (ACUTE ONLY): 24 min  Charges:  $Therapeutic Exercise: 23-37 mins $Therapeutic Activity: 8-22 mins                     Malachi Pro, DPT 05/14/2020, 3:20 PM

## 2020-05-14 NOTE — Progress Notes (Signed)
New order for Xray of right ankle per [place] [facility]

## 2020-05-14 NOTE — Anesthesia Postprocedure Evaluation (Signed)
Anesthesia Post Note  Patient: Rebecca Peck  Procedure(s) Performed: INTRAMEDULLARY (IM) NAIL FEMORAL (Right )  Patient location during evaluation: Nursing Unit Anesthesia Type: Spinal Level of consciousness: awake and alert, awake, patient cooperative and oriented Pain management: pain level controlled Vital Signs Assessment: post-procedure vital signs reviewed and stable Respiratory status: spontaneous breathing, nonlabored ventilation and respiratory function stable Cardiovascular status: stable Postop Assessment: no headache, no backache, adequate PO intake, patient able to bend at knees and no apparent nausea or vomiting   No complications documented.   Last Vitals:  Vitals:   05/14/20 0353 05/14/20 0723  BP: 120/61 (!) 142/71  Pulse: 81 88  Resp: 16 16  Temp: 36.8 C 36.8 C  SpO2: 92% 95%    Last Pain:  Vitals:   05/14/20 0723  TempSrc: Oral  PainSc:                  [name]

## 2020-05-15 LAB — CBC
HCT: 23.8 % — ABNORMAL LOW (ref 36.0–46.0)
Hemoglobin: 8.5 g/dL — ABNORMAL LOW (ref 12.0–15.0)
MCH: 30.7 pg (ref 26.0–34.0)
MCHC: 35.7 g/dL (ref 30.0–36.0)
MCV: 85.9 fL (ref 80.0–100.0)
Platelets: 175 10*3/uL (ref 150–400)
RBC: 2.77 MIL/uL — ABNORMAL LOW (ref 3.87–5.11)
RDW: 14.1 % (ref 11.5–15.5)
WBC: 7.5 10*3/uL (ref 4.0–10.5)
nRBC: 0 % (ref 0.0–0.2)

## 2020-05-15 LAB — BASIC METABOLIC PANEL
Anion gap: 7 (ref 5–15)
BUN: 6 mg/dL — ABNORMAL LOW (ref 8–23)
CO2: 26 mmol/L (ref 22–32)
Calcium: 7.7 mg/dL — ABNORMAL LOW (ref 8.9–10.3)
Chloride: 93 mmol/L — ABNORMAL LOW (ref 98–111)
Creatinine, Ser: 0.57 mg/dL (ref 0.44–1.00)
GFR calc Af Amer: >60 mL/min (ref >60)
GFR calc non Af Amer: >60 mL/min (ref >60)
Glucose, Bld: 100 mg/dL — ABNORMAL HIGH (ref 70–99)
Potassium: 3.1 mmol/L — ABNORMAL LOW (ref 3.5–5.1)
Sodium: 126 mmol/L — ABNORMAL LOW (ref 135–145)

## 2020-05-15 MED ORDER — POTASSIUM CHLORIDE CRYS ER 20 MEQ PO TBCR
40.0000 meq | EXTENDED_RELEASE_TABLET | ORAL | Status: AC
  Administered 2020-05-15 (×2): 40 meq via ORAL
  Filled 2020-05-15 (×2): qty 2

## 2020-05-15 MED ORDER — SENNOSIDES-DOCUSATE SODIUM 8.6-50 MG PO TABS
1.0000 | ORAL_TABLET | Freq: Two times a day (BID) | ORAL | Status: DC
  Administered 2020-05-15 – 2020-05-18 (×7): 1 via ORAL
  Filled 2020-05-15 (×7): qty 1

## 2020-05-15 MED ORDER — NALOXONE HCL 0.4 MG/ML IJ SOLN: 0.4000 mg | INTRAMUSCULAR | Status: DC | PRN

## 2020-05-15 MED ORDER — POTASSIUM CHLORIDE CRYS ER 10 MEQ PO TBCR
EXTENDED_RELEASE_TABLET | ORAL | Status: AC
  Filled 2020-05-15: qty 1

## 2020-05-15 NOTE — Progress Notes (Signed)
Physical Therapy Treatment Patient Details Name: Rebecca Peck MRN: [id number] DOB: Aug 13, 1946 Today's Date: 05/15/2020    History of Present Illness 74 year old female who is household ambulator with a cane who has had trouble with being unsteady with her gait and suffered a fall and subsequent R hip fracture with ORIF IM nailing 6/30.    PT Comments    Pt continues to c/o exquisite pain t/o session but better tolerated simple exercises and some (fleeting) ability to take some weight through R LE in standing.  Yesterday she did not even place heel down and essentially stayed TTWBing on the R, though she lacked confidence today she was able to place some weight through the R heel and manage a few very small, hesitant and unsteady steps to try and get to recliner.  She ultimately let go of the walker, reached for the recliner arm rest and needed assist from PT to control her descent to the recliner before getting squared up with it.  Pt continues to be pain limited and need plenty of encouragement as well as rest breaks and AAROM t/o much of the exercises.    Follow Up Recommendations  SNF;Supervision/Assistance - 24 hour     Equipment Recommendations  3in1 (PT)    Recommendations for Other Services       Precautions / Restrictions Precautions Precautions: Fall Restrictions RLE Weight Bearing: Weight bearing as tolerated    Mobility  Bed Mobility Overal bed mobility: Needs Assistance Bed Mobility: Supine to Sit     Supine to sit: Mod assist     General bed mobility comments: Pt better able to tolerate doing more on her own this date, though still very limited due to pain.    Transfers Overall transfer level: Needs assistance Equipment used: Rolling walker (2 wheeled) Transfers: Sit to/from Stand Sit to Stand: Max assist;Mod assist         General transfer comment: Pt was able to at least put R foot flat and ostensibly take some weight through R LE.  She was very  anxious and showed little ability to follow cuing for walker use, hand placement, safety cues and needed a lot of general encouragement just to manage a few modest shuffling steps  Ambulation/Gait             General Gait Details: It would be difficult to classify her transition bed to recliner as ambulation.  She managed a few very modest "steps" but became anxious reached for the recliner with one hand and then pulled up on the walker while PT controlled her descent into the recliner.   Stairs             Wheelchair Mobility    Modified Rankin (Stroke Patients Only)       Balance Overall balance assessment: Needs assistance   Sitting balance-Leahy Scale: Fair Sitting balance - Comments: leaning back much of the time, but with UEs was able to maintain EOB sitting, constant c/o pain/discomfort in the R hip   Standing balance support: Bilateral upper extremity supported Standing balance-Leahy Scale: Poor Standing balance comment: poor awareness, walker use, WBing and confidence with standing                             Cognition Arousal/Alertness: Awake/alert Behavior During Therapy: Anxious Overall Cognitive Status: Within Functional Limits for tasks assessed  Exercises General Exercises - Lower Extremity Ankle Circles/Pumps: AROM;Strengthening;10 reps Quad Sets: AAROM;10 reps (Pt struggled with any consistency with quad engagement) Short Arc Quad: PROM;10 reps (pain and strength limited) Heel Slides: PROM;10 reps Hip ABduction/ADduction: AAROM;Strengthening;10 reps    General Comments        Pertinent Vitals/Pain Pain Score: 9  Pain Location: R hip and thigh Pain Intervention(s): Premedicated before session;Limited activity within patient's tolerance    Home Living                      Prior Function            PT Goals (current goals can now be found in the care plan  section) Progress towards PT goals: Progressing toward goals    Frequency    BID      PT Plan Current plan remains appropriate    Co-evaluation              AM-PAC PT "6 Clicks" Mobility   Outcome Measure  Help needed turning from your back to your side while in a flat bed without using bedrails?: A Lot Help needed moving from lying on your back to sitting on the side of a flat bed without using bedrails?: Total Help needed moving to and from a bed to a chair (including a wheelchair)?: Total Help needed standing up from a chair using your arms (e.g., wheelchair or bedside chair)?: Total Help needed to walk in hospital room?: Total Help needed climbing 3-5 steps with a railing? : Total 6 Click Score: 7    End of Session Equipment Utilized During Treatment: Gait belt Activity Tolerance: Patient limited by pain Patient left: with chair alarm set;with call bell/phone within reach Nurse Communication: Mobility status PT Visit Diagnosis: Muscle weakness (generalized) (M62.81);Difficulty in walking, not elsewhere classified (R26.2)     Time: 1245-8099 PT Time Calculation (min) (ACUTE ONLY): 45 min  Charges:  $Therapeutic Exercise: 23-37 mins $Therapeutic Activity: 8-22 mins                     Malachi Pro, DPT 05/15/2020, 11:06 AM

## 2020-05-15 NOTE — NC FL2 (Signed)
[place] MEDICAID FL2 LEVEL OF CARE SCREENING TOOL     IDENTIFICATION  Patient Name: Rebecca Peck Birthdate: September 30, 1946 Sex: female Admission Date (Current Location): 05/12/2020  [place] and [place] Number:  Chiropodist and Address:  [facility], [address], [place], [place] [address]      Provider Number: [id number]  Attending Physician Name and Address:  Salomon Mast*  Relative Name and Phone Number:  Leavy Cella [phone]    Current Level of Care: Hospital Recommended Level of Care: Skilled Nursing Facility Prior Approval Number:    Date Approved/Denied:   PASRR Number: [id number]  Discharge Plan: SNF    Current Diagnoses: Patient Active Problem List   Diagnosis Date Noted  . Intertrochanteric fracture of right femur, closed, initial encounter (HCC) 05/12/2020  . Hypothyroidism   . Hypertension   . Hyperlipidemia   . Chronic back pain   . CAD (coronary artery disease)   . Tobacco use     Orientation RESPIRATION BLADDER Height & Weight     Self, Time, Situation, Place  Normal Continent Weight: 55.3 kg Height:  5\' 6"  (167.6 cm)  BEHAVIORAL SYMPTOMS/MOOD NEUROLOGICAL BOWEL NUTRITION STATUS      Continent Diet (regular)  AMBULATORY STATUS COMMUNICATION OF NEEDS Skin   Extensive Assist Verbally Surgical wounds                       Personal Care Assistance Level of Assistance  Bathing, Dressing Bathing Assistance: Limited assistance   Dressing Assistance: Limited assistance     Functional Limitations Info             SPECIAL CARE FACTORS FREQUENCY  PT (By licensed PT)     PT Frequency: times per week              Contractures Contractures Info: Not present    Additional Factors Info  Code Status, Allergies Code Status Info: full code Allergies Info: Sulfa ABX           Current Medications (05/15/2020):  This is the current hospital active medication list Current  Facility-Administered Medications  Medication Dose Route Frequency Provider Last Rate Last Admin  . 0.9 %  sodium chloride infusion   Intravenous Continuous 07/16/2020, MD 75 mL/hr at 05/15/20 1117 New Bag at 05/15/20 1117  . alum & mag hydroxide-simeth (MAALOX/MYLANTA) 200-200-20 MG/5ML suspension 30 mL  30 mL Oral Q4H PRN 05-23-2001, MD      . amLODipine (NORVASC) tablet 10 mg  10 mg Oral Daily Kennedy Bucker, MD   10 mg at 05/15/20 1122  . bisacodyl (DULCOLAX) suppository 10 mg  10 mg Rectal Daily PRN 07/16/20, MD      . carvedilol (COREG) tablet 3.125 mg  3.125 mg Oral BID Kennedy Bucker, MD   3.125 mg at 05/15/20 1122  . Chlorhexidine Gluconate Cloth 2 % PADS 6 each  6 each Topical Daily 07/16/20, MD      . cyclobenzaprine (FLEXERIL) tablet 10 mg  10 mg Oral TID Arnetha Courser, MD   10 mg at 05/15/20 1122  . cyclobenzaprine (FLEXERIL) tablet 5 mg  5 mg Oral TID PRN 07/16/20, MD   5 mg at 05/15/20 0454  . docusate sodium (COLACE) capsule 100 mg  100 mg Oral BID 07/16/20, MD   100 mg at 05/15/20 1122  . enoxaparin (LOVENOX) injection 40 mg  40 mg Subcutaneous Q24H 07/16/20, MD   40 mg  at 05/15/20 0819  . ferrous fumarate-b12-vitamic C-folic acid (TRINSICON / FOLTRIN) capsule 1 capsule  1 capsule Oral BID Evon Slack, PA-C   1 capsule at 05/15/20 1121  . HYDROmorphone (DILAUDID) injection 1 mg  1 mg Intravenous Q4H PRN Kennedy Bucker, MD   1 mg at 05/15/20 0150  . levothyroxine (SYNTHROID) tablet 125 mcg  125 mcg Oral Daily Kennedy Bucker, MD   125 mcg at 05/15/20 0557  . lisinopril (ZESTRIL) tablet 20 mg  20 mg Oral Daily Kennedy Bucker, MD   20 mg at 05/15/20 1122  . magnesium citrate solution 1 Bottle  1 Bottle Oral Once PRN Kennedy Bucker, MD      . magnesium hydroxide (MILK OF MAGNESIA) suspension 30 mL  30 mL Oral Daily PRN Kennedy Bucker, MD      . menthol-cetylpyridinium (CEPACOL) lozenge 3 mg  1 lozenge Oral PRN Kennedy Bucker, MD       Or  . phenol  (CHLORASEPTIC) mouth spray 1 spray  1 spray Mouth/Throat PRN Kennedy Bucker, MD      . metoCLOPramide (REGLAN) tablet 5-10 mg  5-10 mg Oral Q8H PRN Kennedy Bucker, MD       Or  . metoCLOPramide (REGLAN) injection 5-10 mg  5-10 mg Intravenous Q8H PRN Kennedy Bucker, MD   5 mg at 05/14/20 0026  . naloxone [facility]) injection 0.4 mg  0.4 mg Intravenous PRN Leandro Reasoner Tublu, MD      . ondansetron [facility]) injection 4 mg  4 mg Intravenous Q6H PRN Kennedy Bucker, MD   4 mg at 05/13/20 0747  . ondansetron (ZOFRAN) tablet 4 mg  4 mg Oral Q6H PRN Kennedy Bucker, MD       Or  . ondansetron [facility]) injection 4 mg  4 mg Intravenous Q6H PRN Kennedy Bucker, MD   4 mg at 05/14/20 1508  . oxyCODONE (Oxy IR/ROXICODONE) immediate release tablet 10 mg  10 mg Oral Q4H PRN Evon Slack, PA-C   10 mg at 05/15/20 0818  . oxyCODONE (Oxy IR/ROXICODONE) immediate release tablet 10 mg  10 mg Oral 5 X Daily Kennedy Bucker, MD   10 mg at 05/15/20 1124  . pantoprazole (PROTONIX) EC tablet 40 mg  40 mg Oral Daily Kennedy Bucker, MD   40 mg at 05/15/20 0818  . polyethylene glycol (MIRALAX / GLYCOLAX) packet 17 g  17 g Oral Daily Kennedy Bucker, MD   17 g at 05/15/20 1126  . potassium chloride SA (KLOR-CON) CR tablet 40 mEq  40 mEq Oral Q4H Leandro Reasoner Tublu, MD      . pravastatin (PRAVACHOL) tablet 20 mg  20 mg Oral Daily Kennedy Bucker, MD   20 mg at 05/14/20 2216  . promethazine (PHENERGAN) tablet 25 mg  25 mg Oral Q8H PRN Kennedy Bucker, MD   25 mg at 05/12/20 2156  . senna-docusate (Senokot-S) tablet 1 tablet  1 tablet Oral BID Evon Slack, PA-C   1 tablet at 05/15/20 8768  . zolpidem (AMBIEN) tablet 5 mg  5 mg Oral QHS PRN Kennedy Bucker, MD         Discharge Medications: Please see discharge summary for a list of discharge medications.  Relevant Imaging Results:  Relevant Lab Results:   Additional Information SS# [id number]  Barrie Dunker, RN

## 2020-05-15 NOTE — Progress Notes (Signed)
PROGRESS NOTE    Rebecca Peck  NWG:[id number]  DOB: 08/11/1946  DOA: 05/12/2020 PCP: Patient, No Pcp Per Outpatient Specialists:   Hospital course:  Rebecca Leech Pickardis a 74 y.o.femalewith medical history significant forCADwith remote stent placement, hypertension, hyperlipidemia, hypothyroidism, GERD,tobacco use,and chronic back pain who was admitted 05/12/2020 with right hip fracture status post mechanical fall.   Subjective:  Patient states her pain is better controlled since she was started on the oxycodone.  When I note that she appears sleepy she states very forcefully "I am not sleepy, my eyes just droop".  Patient's son who is at bedside notes that she is a little bit more sleepy than usual but also agrees that her eyes droop.  She has no other complaints.  Patient denies any chest pain or shortness of breath.  Objective: Vitals:   05/15/20 0449 05/15/20 0812 05/15/20 1113 05/15/20 1556  BP: 132/66 131/79 (!) 153/76 (!) 147/88  Pulse: 86 84 87 93  Resp: 16 18  18   Temp: 98.5 F (36.9 C) 99.7 F (37.6 C) 98.3 F (36.8 C) 98.4 F (36.9 C)  TempSrc: Oral Oral Oral Oral  SpO2: 91% 90% 92% 91%  Weight:      Height:        Intake/Output Summary (Last 24 hours) at 05/15/2020 1826 Last data filed at 05/15/2020 1558 Gross per 24 hour  Intake 822.22 ml  Output 1000 ml  Net -177.78 ml   Filed Weights   05/12/20 1551 05/13/20 1420  Weight: 55.3 kg 55.3 kg     Exam:  General: Tired appearing female lying in bed somewhat listless laying Eyes: sclera anicteric, conjuctiva mild injection bilaterally CVS: S1-S2, regular  Respiratory:  decreased air entry bilaterally secondary to decreased inspiratory effort, rales at bases  GI: NABS, soft, NT  LE: No edema.  Neuro: grossly nonfocal.    Assessment & Plan:    Right hip fracture.   Secondary to mechanical fall. POD #2 status post intramedullary femoral nail on right. PT OT per hip fracture  protocol  Hypokalemia We will replete and recheck  Chronic hyponatremia.   Patient [facility] at [facility] and seems to have chronic hyponatremia with sodiums around 125. Sodium here was somewhat lower on admission however this is improved to her baseline with IV fluid resuscitation.  History of CAD.   S/p PCI many years ago.  No chest pain. Continue carvedilol and pravastatin.  Hypertension.   Under reasonable control on home doses of carvedilol, amlodipine, and lisinopril  Hyperlipidemia: Continue pravastatin  Hypothyroidism: Continue Synthroid.  Chronic back pain: Continue home oxycodone in addition to the as needed pain medication she is getting for her hip fracture.3   DVT prophylaxis: SCD, Lovenox to be restarted per orthopedics Code Status: Full Family Communication: Son was at bedside throughout Disposition Plan:   Patient is from: Home  Anticipated Discharge Location: Rehab  Barriers to Discharge: Status post ORIF yesterday  Is patient medically stable for Discharge: Yes   Consultants:  Orthopedics  Procedures:  Status post ORIF 05/13/2020  Antimicrobials:  None   Data Reviewed:  Basic Metabolic Panel: Recent Labs  Lab 05/12/20 1957 05/13/20 0427 05/14/20 0426 05/15/20 0619  NA 122* 125* 127* 126*  K 3.3* 4.2 3.5 3.1*  CL 90* 94* 95* 93*  CO2 25 23 25 26   GLUCOSE 118* 100* 103* 100*  BUN 10 9 9  6*  CREATININE 0.72 0.67 0.57 0.57  CALCIUM 8.7* 8.5* 8.1* 7.7*  MG 1.9  --   --   --  Liver Function Tests: No results for input(s): AST, ALT, ALKPHOS, BILITOT, PROT, ALBUMIN in the last 168 hours. No results for input(s): LIPASE, AMYLASE in the last 168 hours. No results for input(s): AMMONIA in the last 168 hours. CBC: Recent Labs  Lab 05/12/20 1957 05/13/20 0427 05/14/20 0426 05/15/20 0619  WBC 19.3* 12.5* 10.4 7.5  HGB 12.0 11.0* 9.4* 8.5*  HCT 32.7* 32.0* 27.0* 23.8*  MCV 83.2 85.8 87.1 85.9  PLT 284 242 195 175   Cardiac  Enzymes: No results for input(s): CKTOTAL, CKMB, CKMBINDEX, TROPONINI in the last 168 hours. BNP (last 3 results) No results for input(s): PROBNP in the last 8760 hours. CBG: No results for input(s): GLUCAP in the last 168 hours.  Recent Results (from the past 240 hour(s))  SARS Coronavirus 2 by RT PCR (hospital order, performed in [facility] hospital lab) Nasopharyngeal Nasopharyngeal Swab     Status: None   Collection Time: 05/12/20  6:25 PM   Specimen: Nasopharyngeal Swab  Result Value Ref Range Status   SARS Coronavirus 2 NEGATIVE NEGATIVE Final    Comment: (NOTE) SARS-CoV-2 target nucleic acids are NOT DETECTED.  The SARS-CoV-2 RNA is generally detectable in upper and lower respiratory specimens during the acute phase of infection. The lowest concentration of SARS-CoV-2 viral copies this assay can detect is 250 copies / mL. A negative result does not preclude SARS-CoV-2 infection and should not be used as the sole basis for treatment or other patient management decisions.  A negative result may occur with improper specimen collection / handling, submission of specimen other than nasopharyngeal swab, presence of viral mutation(s) within the areas targeted by this assay, and inadequate number of viral copies (<250 copies / mL). A negative result must be combined with clinical observations, patient history, and epidemiological information.  Fact Sheet for Patients:   BoilerBrush.com.cy  Fact Sheet for Healthcare Providers: https://pope.com/  This test is not yet approved or  cleared by the [place] FDA and has been authorized for detection and/or diagnosis of SARS-CoV-2 by FDA under an Emergency Use Authorization (EUA).  This EUA will remain in effect (meaning this test can be used) for the duration of the COVID-19 declaration under Section 564(b)(1) of the Act, 21 U.S.C. section 360bbb-3(b)(1), unless the authorization is  terminated or revoked sooner.  Performed at [facility], [address] [place], [place] [address]       Studies: DG Ankle Right Port  Result Date: 05/14/2020 CLINICAL DATA:  Right ankle pain and swelling.  Prior surgery. EXAM: PORTABLE RIGHT ANKLE - 2 VIEW COMPARISON:  No recent. FINDINGS: Diffuse soft tissue swelling. No acute bony or joint abnormality identified. No evidence of fracture or dislocation. IMPRESSION: Diffuse soft tissue swelling. No acute bony or joint abnormality identified. No evidence of fracture or dislocation. Electronically Signed   By: Maisie Fus  Register   On: 05/14/2020 13:33     Scheduled Meds: . amLODipine  10 mg Oral Daily  . carvedilol  3.125 mg Oral BID  . Chlorhexidine Gluconate Cloth  6 each Topical Daily  . cyclobenzaprine  10 mg Oral TID  . docusate sodium  100 mg Oral BID  . enoxaparin (LOVENOX) injection  40 mg Subcutaneous Q24H  . ferrous fumarate-b12-vitamic C-folic acid  1 capsule Oral BID  . levothyroxine  125 mcg Oral Daily  . lisinopril  20 mg Oral Daily  . oxyCODONE  10 mg Oral 5 X Daily  . pantoprazole  40 mg Oral Daily  .  polyethylene glycol  17 g Oral Daily  . potassium chloride      . pravastatin  20 mg Oral Daily  . senna-docusate  1 tablet Oral BID   Continuous Infusions: . sodium chloride 75 mL/hr at 05/15/20 1117    Principal Problem:   Intertrochanteric fracture of right femur, closed, initial encounter (HCC) Active Problems:   Hypothyroidism   Hypertension   Hyperlipidemia   Chronic back pain   CAD (coronary artery disease)   Tobacco use     Demarquis Osley Tublu Antoino Westhoff, [facility]  If 7PM-7AM, please contact night-coverage [email or web address] Password [email or web address] 05/15/2020, 6:26 PM    LOS: 3 days

## 2020-05-15 NOTE — Care Management Important Message (Signed)
Important Message  Patient Details  Name: Rebecca Peck MRN: [id number] Date of Birth: Dec 21, 1945   Medicare Important Message Given:  Yes     [name] A Malayzia Laforte 05/15/2020, 11:05 AM

## 2020-05-15 NOTE — TOC Progression Note (Signed)
Transition of Care [facility]) - Progression Note    Patient Details  Name: Rebecca Peck MRN: [id number] Date of Birth: 06-18-1946  Transition of Care [facility]) CM/SW [place], RN Phone Number: 05/15/2020, 4:10 PM  Clinical Narrative:    Met with the son and the patient they accepted the bed offer From [facility], I called [facility] and accepted the bed offer, they cant take her today or over the weekend due to the [place] weekend and Pharmacy being closed, They can accept her on Monday. I called [facility]  To request a rapid review for SNF Start date Monday 05/18/20 Ref number [id number] Faxed clinical information to [facility] [phone]  Approved for 6 days started today next review 7/7  Coordinator Mickel Baas          Expected Discharge Plan and Services                                                 Social Determinants of Health (SDOH) Interventions    Readmission Risk Interventions No flowsheet data found.

## 2020-05-15 NOTE — TOC Progression Note (Addendum)
Transition of Care [facility]) - Progression Note    Patient Details  Name: FLORENCIA ZACCARO MRN: [id number] Date of Birth: Feb 25, 1946  Transition of Care [facility]) CM/SW [place], RN Phone Number: 05/15/2020, 2:58 PM  Clinical Narrative:     Met with the patient and her Son in the room at the bedside, I provided them with the bed offers and let them know we were able to get an offer from [facility] like they wanted and we reviewed the Medicare Star Ratings for each, I encouraged him to look up on Medicare.gov to review in more detail, I explained to him that they would need a choice to start the insurance process and that the insurance company is closed over the weekend, Her son stated that he may not know today what facility they want to send her to, I explained that once the patient is medically cleared then she will be ready to DC, We need Insurance approval for her to go to SNF and to get that we need to know a facility to start the Insurance auth process.  I explained that once she is medically clear for DC then the patient could potentially be responsible for the Hospital stay afetr she is ready for DC if she has not discharged.  He stated that he understands completely but is not ready to make a decision on which facility, he did say that the [facility] is ruled out due to [facility] and that he would make phone calls about the other two.  The patient has not had vaccines for Covid, I encouraged him to call the facility to find out about the visitation policy.  I requested that he let me know ASAP on which of the 2 bed offers he would like to accept, He agreed but stated it might not be today       Expected Discharge Plan and Services                                                 Social Determinants of Health (SDOH) Interventions    Readmission Risk Interventions No flowsheet data found.

## 2020-05-15 NOTE — Progress Notes (Signed)
Am rounding patient is resting well, began to yelp out when son arrived to room. Patient's son berated nursing staff regarding IV pain medication and wanted all pain medications to be scheduled starting July 1. Extensive education given along with several conversations to hospitalist. Reached out to surgeon Rosita Kea to evaluate pain medications. New adjusted orders reflecting pain management schedule.

## 2020-05-15 NOTE — Progress Notes (Signed)
Physical Therapy Treatment Patient Details Name: Rebecca Peck MRN: [id number] DOB: March 03, 1946 Today's Date: 05/15/2020    History of Present Illness 74 year old female who is household ambulator with a cane who has had trouble with being unsteady with her gait and suffered a fall and subsequent R hip fracture with ORIF IM nailing 6/30.    PT Comments    Pt continues to be very pain limited and pain focused.  She had spasms in R LE, c/o decreased proprioception and sensation (has some at baseline, maybe worse now?) and struggles to put together more than 2-3 reps at a time before needing a rest break to continue exercises.  As per usual tried to organize pain meds ~45 minutes before session to maximize her ability to participate but generally speaking pain has continued to be a major limiter.  Pt did show good effort when she was able to muster the energy and calm the pain but ultimately she is hardly able to put weight through R LE (WBAT), cannot engage R quads effectively and struggles with pain with even the most inconsequential movement in the R LE (hip or otherwise).  Pt making slow gains, son present t/o session and helpful in encouraging participation per tolerance.  Follow Up Recommendations  SNF;Supervision/Assistance - 24 hour     Equipment Recommendations    will likely need BSC when she goes home   Recommendations for Other Services       Precautions / Restrictions Precautions Precautions: Fall Restrictions RLE Weight Bearing: Weight bearing as tolerated    Mobility  Bed Mobility Overal bed mobility: Needs Assistance Bed Mobility: Supine to Sit;Sit to Supine     Supine to sit: Max assist;Mod assist Sit to supine: Max assist   General bed mobility comments: Pt makes some effort in getting to EOB, but ultimately pain levels neccesitated heavy assist to rise to sitting  Transfers Overall transfer level: Needs assistance Equipment used: Rolling walker (2  wheeled) Transfers: Sit to/from Stand Sit to Stand: Max assist;Mod assist         General transfer comment: Pt again hesitant to initiate rise to standing, was able to maintain upright (heavily assisted) X ~74minute but c/o pain the entire time, poor tolerance though she did appear to be able to put a little weight through R LE/heel with cuing to try and use R LE as able  Ambulation/Gait             General Gait Details: extremely labored effort with trying to move R foot even 1/2", unable to step/walk this date   Stairs             Wheelchair Mobility    Modified Rankin (Stroke Patients Only)       Balance Overall balance assessment: Needs assistance   Sitting balance-Leahy Scale: Fair Sitting balance - Comments: once assisted to EOB and appropriately squared with edge she did not need constant assist to maintain sitting posture   Standing balance support: Bilateral upper extremity supported Standing balance-Leahy Scale: Poor Standing balance comment: highly reliant on walker and assist from PT to maintain standing                            Cognition Arousal/Alertness: Awake/alert Behavior During Therapy: Anxious Overall Cognitive Status: Within Functional Limits for tasks assessed  Exercises General Exercises - Lower Extremity Ankle Circles/Pumps: AROM;10 reps Quad Sets: AAROM;5 reps Short Arc Quad: 10 reps;AAROM (able to give brief against gravity resistance mid range) Heel Slides: AAROM;10 reps (better able to parrticipate w/ these, pain limited ) Hip ABduction/ADduction: Strengthening;AAROM;10 reps    General Comments        Pertinent Vitals/Pain Pain Score: 9  Pain Intervention(s): Premedicated before session;Patient requesting pain meds-RN notified;Limited activity within patient's tolerance;Utilized relaxation techniques    Home Living                      Prior  Function            PT Goals (current goals can now be found in the care plan section) Progress towards PT goals: Progressing toward goals    Frequency    BID      PT Plan Current plan remains appropriate    Co-evaluation              AM-PAC PT "6 Clicks" Mobility   Outcome Measure  Help needed turning from your back to your side while in a flat bed without using bedrails?: A Lot Help needed moving from lying on your back to sitting on the side of a flat bed without using bedrails?: Total Help needed moving to and from a bed to a chair (including a wheelchair)?: Total Help needed standing up from a chair using your arms (e.g., wheelchair or bedside chair)?: Total Help needed to walk in hospital room?: Total Help needed climbing 3-5 steps with a railing? : Total 6 Click Score: 7    End of Session Equipment Utilized During Treatment: Gait belt Activity Tolerance: Patient limited by pain Patient left: with call bell/phone within reach;with bed alarm set;with family/visitor present Nurse Communication: Mobility status;Patient requests pain meds PT Visit Diagnosis: Muscle weakness (generalized) (M62.81);Difficulty in walking, not elsewhere classified (R26.2)     Time: 4098-1191 PT Time Calculation (min) (ACUTE ONLY): 43 min  Charges:  $Therapeutic Exercise: 23-37 mins $Therapeutic Activity: 8-22 mins                     Malachi Pro, DPT 05/15/2020, 4:52 PM

## 2020-05-15 NOTE — TOC Progression Note (Addendum)
Transition of Care [facility]) - Progression Note    Patient Details  Name: Rebecca Peck MRN: [id number] Date of Birth: Aug 09, 1946  [facility]) CM/SW Contact  Barrie Dunker, RN Phone Number: 05/15/2020, 1:18 PM  Clinical Narrative:     Spoke with the son and the patient in the room, they would like for her to go to SNF, She has not had covid vaccines I completed Fl2, and sent out the bed search, will review once they are obtained, they requested a bed from [facility]       Expected Discharge Plan and Services                                                 Social Determinants of Health (SDOH) Interventions    Readmission Risk Interventions No flowsheet data found.

## 2020-05-15 NOTE — Progress Notes (Signed)
   Subjective: 2 Days Post-Op Procedure(s) (LRB): INTRAMEDULLARY (IM) NAIL FEMORAL (Right) Patient reports pain as mild.   Patient is well, and has had no acute complaints or problems Denies any CP, SOB, ABD pain. We will continue with physical therapy today   Objective: Vital signs in last 24 hours: Temp:  [98.1 F (36.7 C)-99.7 F (37.6 C)] 98.5 F (36.9 C) (07/02 0449) Pulse Rate:  [83-94] 86 (07/02 0449) Resp:  [14-19] 16 (07/02 0449) BP: (128-164)/(66-87) 132/66 (07/02 0449) SpO2:  [88 %-95 %] 91 % (07/02 0449)  Intake/Output from previous day: 07/01 0701 - 07/02 0700 In: 1848 [P.O.:240; I.V.:1608] Out: 700 [Urine:700] Intake/Output this shift: No intake/output data recorded.  Recent Labs    05/12/20 1957 05/13/20 0427 05/14/20 0426 05/15/20 0619  HGB 12.0 11.0* 9.4* 8.5*   Recent Labs    05/14/20 0426 05/15/20 0619  WBC 10.4 7.5  RBC 3.10* 2.77*  HCT 27.0* 23.8*  PLT 195 175   Recent Labs    05/14/20 0426 05/15/20 0619  NA 127* 126*  K 3.5 3.1*  CL 95* 93*  CO2 25 26  BUN 9 6*  CREATININE 0.57 0.57  GLUCOSE 103* 100*  CALCIUM 8.1* 7.7*   Recent Labs    05/12/20 1957  INR 1.0    EXAM General - Patient is Alert, Appropriate and Oriented Extremity - Neurovascular intact Sensation intact distally Intact pulses distally Dorsiflexion/Plantar flexion intact No cellulitis present Compartment soft Dressing - dressing C/D/I and no drainage Motor Function - intact, moving foot and toes well on exam.   Past Medical History:  Diagnosis Date  . CAD (coronary artery disease)   . Chronic back pain   . Hyperlipidemia   . Hypertension   . Hypothyroidism   . Tobacco use     Assessment/Plan:   2 Days Post-Op Procedure(s) (LRB): INTRAMEDULLARY (IM) NAIL FEMORAL (Right) Principal Problem:   Intertrochanteric fracture of right femur, closed, initial encounter (HCC) Active Problems:   Hypothyroidism   Hypertension   Hyperlipidemia   Chronic  back pain   CAD (coronary artery disease)   Tobacco use  Estimated body mass index is 19.69 kg/m as calculated from the following:   Height as of this encounter: 5\' 6"  (1.676 m).   Weight as of this encounter: 55.3 kg. Advance diet Up with therapy  Work on bowel movement Acute postop blood loss anemia-hemoglobin 8.5.  Continue with iron supplement.  Recheck labs tomorrow Pain mild.  Care management to assist with discharge to SNF   Lovenox 40 mg subcu daily x14 days at discharge TED hose bilateral lower extremity x6 weeks Follow-up with [facility] in 2 weeks Prescriptions for pain medication and blood thinner in chart.  DVT Prophylaxis - Lovenox, TED hose and SCDs Weight-Bearing as tolerated to right leg   T. , PA-C [facility] Orthopa[facility] 05/15/2020, 7:58 AM

## 2020-05-16 DIAGNOSIS — S72001A Fracture of unspecified part of neck of right femur, initial encounter for closed fracture: Secondary | ICD-10-CM

## 2020-05-16 LAB — BASIC METABOLIC PANEL
Anion gap: 7 (ref 5–15)
BUN: 6 mg/dL — ABNORMAL LOW (ref 8–23)
CO2: 25 mmol/L (ref 22–32)
Calcium: 7.9 mg/dL — ABNORMAL LOW (ref 8.9–10.3)
Chloride: 91 mmol/L — ABNORMAL LOW (ref 98–111)
Creatinine, Ser: 0.5 mg/dL (ref 0.44–1.00)
GFR calc Af Amer: >60 mL/min (ref >60)
GFR calc non Af Amer: >60 mL/min (ref >60)
Glucose, Bld: 101 mg/dL — ABNORMAL HIGH (ref 70–99)
Potassium: 4 mmol/L (ref 3.5–5.1)
Sodium: 123 mmol/L — ABNORMAL LOW (ref 135–145)

## 2020-05-16 LAB — CREATININE, URINE, RANDOM: Creatinine, Urine: 58 mg/dL

## 2020-05-16 LAB — OSMOLALITY, URINE: Osmolality, Ur: 442 mOsm/kg (ref 300–900)

## 2020-05-16 LAB — SODIUM, URINE, RANDOM: Sodium, Ur: 95 mmol/L

## 2020-05-16 LAB — MAGNESIUM: Magnesium: 2.1 mg/dL (ref 1.7–2.4)

## 2020-05-16 MED ORDER — BISACODYL 10 MG RE SUPP
10.0000 mg | Freq: Once | RECTAL | Status: AC
  Administered 2020-05-16: 10 mg via RECTAL

## 2020-05-16 NOTE — Progress Notes (Signed)
Offered to assist pt with bath.  Pt said no and was yelling out due to muscle spasms.  I told her to let us know when she felt more up to getting washed and we would assist her.

## 2020-05-16 NOTE — Progress Notes (Signed)
Physical Therapy Treatment Patient Details Name: Rebecca Peck MRN: [id number] DOB: [birth year] Today's Date: 05/16/2020    History of Present Illness 74 year old female who is household ambulator with a cane who has had trouble with being unsteady with her gait and suffered a fall and subsequent R hip fracture with ORIF IM nailing 6/30.    PT Comments    Patient alert, does tend to close eyes during session but able to answer questions/follow commands, premedicated prior to session but exclaims and moans with any body movements. Several supine exercises performed AAROM for all RLE due to pain, though may have had improved quad sets quality compared to previous sessions. Supine to sit with maxA and HOB elevated. Pt needed extended time and assistance to reposition at EOB, required bilateral UE support for balance. Sit <> stand attempted twice; maxA with RW, pt unable to achieve fully standing position, severe pain reported. Re-attempted maxAx2 to allow pt to better off set weight from LEs, pt unable to clear either foot from floor in preparation to transfer to chair. Returned to supine maxAx2 for safety, all needs in reach. The patient would benefit from further skilled PT intervention to continue to progress towards goals. Recommendation remains appropriate.     Follow Up Recommendations  SNF;Supervision/Assistance - 24 hour     Equipment Recommendations  3in1 (PT)    Recommendations for Other Services       Precautions / Restrictions Precautions Precautions: Fall Restrictions Weight Bearing Restrictions: Yes RLE Weight Bearing: Weight bearing as tolerated    Mobility  Bed Mobility Overal bed mobility: Needs Assistance Bed Mobility: Supine to Sit;Sit to Supine     Supine to sit: Max assist;HOB elevated Sit to supine: Max assist;+2 for physical assistance   General bed mobility comments: Pt makes some effort in getting to EOB, but ultimately pain levels neccesitated heavy  assist to rise to sitting  Transfers Overall transfer level: Needs assistance Equipment used: Rolling walker (2 wheeled) Transfers: Sit to/from Stand Sit to Stand: Max assist;+2 physical assistance         General transfer comment: maxA with one assist, second attempt maxAx2 to allow pt to offweight LEs to facilitate ability to takes steps, pt with very poor weight bearing throughou bilateral LEs  Ambulation/Gait             General Gait Details: extremely labored effort with trying to move R foot even 1/2", unable to step/walk this date   Stairs             Wheelchair Mobility    Modified Rankin (Stroke Patients Only)       Balance Overall balance assessment: Needs assistance   Sitting balance-Leahy Scale: Poor Sitting balance - Comments: reliant on UE support   Standing balance support: Bilateral upper extremity supported Standing balance-Leahy Scale: Zero Standing balance comment: highly reliant on walker and assist from PT to maintain standing                            Cognition Arousal/Alertness: Awake/alert Behavior During Therapy: Anxious Overall Cognitive Status: Within Functional Limits for tasks assessed                                        Exercises General Exercises - Lower Extremity Ankle Circles/Pumps: AROM;10 reps Quad Sets: AROM;Strengthening;Right;10 reps Short Arc Quad: AAROM;Strengthening;Right;10 reps  Heel Slides: AAROM;10 reps;Strengthening;Right;AROM;Left Hip ABduction/ADduction: Strengthening;AAROM;10 reps;AROM;Left    General Comments        Pertinent Vitals/Pain Pain Assessment: 0-10 Pain Score: 10-Worst pain ever Pain Location: R hip and thigh Pain Intervention(s): Limited activity within patient's tolerance;Monitored during session;Premedicated before session;Repositioned    Home Living                      Prior Function            PT Goals (current goals can now be  found in the care plan section) Progress towards PT goals: Progressing toward goals (slowly)    Frequency    BID      PT Plan Current plan remains appropriate    Co-evaluation              AM-PAC PT "6 Clicks" Mobility   Outcome Measure  Help needed turning from your back to your side while in a flat bed without using bedrails?: A Lot Help needed moving from lying on your back to sitting on the side of a flat bed without using bedrails?: Total Help needed moving to and from a bed to a chair (including a wheelchair)?: Total Help needed standing up from a chair using your arms (e.g., wheelchair or bedside chair)?: Total Help needed to walk in hospital room?: Total Help needed climbing 3-5 steps with a railing? : Total 6 Click Score: 7    End of Session Equipment Utilized During Treatment: Gait belt Activity Tolerance: Patient limited by pain Patient left: with call bell/phone within reach;with bed alarm set;with family/visitor present;in bed Nurse Communication: Mobility status;Patient requests pain meds PT Visit Diagnosis: Muscle weakness (generalized) (M62.81);Difficulty in walking, not elsewhere classified (R26.2)     Time: 4944-9675 PT Time Calculation (min) (ACUTE ONLY): 53 min  Charges:  $Therapeutic Exercise: 38-52 mins                     Olga Coaster PT, DPT 1:49 PM,05/16/20

## 2020-05-16 NOTE — Progress Notes (Addendum)
  Subjective: 3 Days Post-Op Procedure(s) (LRB): INTRAMEDULLARY (IM) NAIL FEMORAL (Right) Patient reports pain as severe.   Plan is to go Skilled nursing facility after hospital stay. Negative for chest pain and shortness of breath Fever: no Gastrointestinal: positive for nausea .   Objective: Vital signs in last 24 hours: Temp:  [98.2 F (36.8 C)-98.7 F (37.1 C)] 98.2 F (36.8 C) (07/03 0739) Pulse Rate:  [86-95] 95 (07/03 0739) Resp:  [18-20] 18 (07/03 0739) BP: (137-162)/(80-92) 162/92 (07/03 0739) SpO2:  [91 %-93 %] 91 % (07/03 0739)  Intake/Output from previous day:  Intake/Output Summary (Last 24 hours) at 05/16/2020 1157 Last data filed at 05/16/2020 1003 Gross per 24 hour  Intake 2004.35 ml  Output 2125 ml  Net -120.65 ml    Intake/Output this shift: Total I/O In: 240 [P.O.:240] Out: 600 [Urine:600]  Labs: Recent Labs    05/14/20 0426 05/15/20 0619  HGB 9.4* 8.5*   Recent Labs    05/14/20 0426 05/15/20 0619  WBC 10.4 7.5  RBC 3.10* 2.77*  HCT 27.0* 23.8*  PLT 195 175   Recent Labs    05/15/20 0619 05/16/20 0422  NA 126* 123*  K 3.1* 4.0  CL 93* 91*  CO2 26 25  BUN 6* 6*  CREATININE 0.57 0.50  GLUCOSE 100* 101*  CALCIUM 7.7* 7.9*   No results for input(s): LABPT, INR in the last 72 hours.   EXAM General - Patient is Alert, Appropriate and Oriented Extremity - Dorsiflexion/Plantar flexion intact Compartment soft; sensation intact over the medial and lateral lower leg, decreased to light touch over the dorsum of the foot, foot warm without edema Dressing/Incision -proximal, middle, and distal honeycomb dressings remain in place. Mild sanguinous drainage noted from the middle dressing, otherwise clean. Motor Function - intact, moving foot and toes well on exam.  Gastrointestinal- abdomen distended.     Assessment/Plan: 3 Days Post-Op Procedure(s) (LRB): INTRAMEDULLARY (IM) NAIL FEMORAL (Right) Principal Problem:   Intertrochanteric  fracture of right femur, closed, initial encounter (HCC) Active Problems:   Hypothyroidism   Hypertension   Hyperlipidemia   Chronic back pain   CAD (coronary artery disease)   Tobacco use  Estimated body mass index is 19.69 kg/m as calculated from the following:   Height as of this encounter: 5\' 6"  (1.676 m).   Weight as of this encounter: 55.3 kg. Advance diet Up with therapy Care management to assist with d/c to SNF   DVT Prophylaxis - Lovenox, Ted hose and SCDs Weight-Bearing as tolerated to right leg  , PA-C [facility] Orthopaedic Surgery 05/16/2020, 11:57 AM

## 2020-05-16 NOTE — Progress Notes (Addendum)
PROGRESS NOTE    Rebecca Peck  ZHY:[id number]  DOB: [birth year]  DOA: 05/12/2020 PCP: Patient, No Pcp Per Outpatient Specialists:   Hospital course:  Rebecca Peck Pickardis a 74 y.o.femalewith medical history significant forCADwith remote stent placement, hypertension, hyperlipidemia, hypothyroidism, GERD,tobacco use,and chronic back pain who was admitted 05/12/2020 with right hip fracture status post mechanical fall.   Subjective:  Patient without new complaints this morning.  She states she continues to have pain which prevents her from participating in PT as much as she would like.  She continues to deny feeling sleepy even though she does doze off while I am speaking with her.  She denies any chest pain or shortness of breath.  She does admit that she is not had a bowel movement for couple of days and states she has not had much to eat.  Objective: Vitals:   05/15/20 1556 05/15/20 2236 05/15/20 2348 05/16/20 0739  BP: (!) 147/88 (!) 143/80 137/81 (!) 162/92  Pulse: 93 87 86 95  Resp: 18 20 20 18   Temp: 98.4 F (36.9 C) 98.3 F (36.8 C) 98.7 F (37.1 C) 98.2 F (36.8 C)  TempSrc: Oral Oral Oral Oral  SpO2: 91% 93% 91% 91%  Weight:      Height:        Intake/Output Summary (Last 24 hours) at 05/16/2020 1249 Last data filed at 05/16/2020 1003 Gross per 24 hour  Intake 2004.35 ml  Output 2125 ml  Net -120.65 ml   Filed Weights   05/12/20 1551 05/13/20 1420  Weight: 55.3 kg 55.3 kg     Exam:  General: Tired appearing female lying in bed somewhat listless laying Eyes: sclera anicteric, conjuctiva mild injection bilaterally CVS: S1-S2, regular  Respiratory:  decreased air entry bilaterally secondary to decreased inspiratory effort, rales at bases  GI: Abdomen has normoactive bowel sounds, slightly distended, is firm but not hard, has no tenderness to light or deep palpation. LE: No edema.  Neuro: grossly nonfocal.    Assessment & Plan:    Right hip  fracture.   Secondary to mechanical fall. POD #3 status post intramedullary femoral nail on right. PT OT per hip fracture protocol Plan is for discharge to [facility] on Monday.  Chronic hyponatremia.   Sodium is somewhat lower than it has been although patient is asymptomatic from this viewpoint. BUN is 6 so I do not think this is likely secondary to intravascular volume depletion. Will send serum osmolality and urine for electrolytes to see if there is an element of SIADH.  Constipation MiraLAX was added yesterday, will add Dulcolax suppository x1 today.  Hypokalemia Normalized with repletion.  History of CAD.   S/p PCI many years ago.  No chest pain. Continue carvedilol and pravastatin.  Hypertension.   Under reasonable control on home doses of carvedilol, amlodipine, and lisinopril  Hyperlipidemia: Continue pravastatin  Hypothyroidism: Continue Synthroid.  Chronic back pain: Continue home oxycodone in addition to the as needed pain medication she is getting for her hip fracture.3   DVT prophylaxis: SCD, Lovenox to be restarted per orthopedics Code Status: Full Family Communication: Son was at bedside throughout Disposition Plan:   Patient is from: Home  Anticipated Discharge Location: Rehab  Barriers to Discharge: Awaiting bed to open at [facility] which will happen in 2 days on Monday.  Is patient medically stable for Discharge: Yes   Consultants:  Orthopedics  Procedures:  Status post ORIF 05/13/2020  Antimicrobials:  None   Data Reviewed:  Basic Metabolic Panel: Recent Labs  Lab 05/12/20 1957 05/13/20 0427 05/14/20 0426 05/15/20 0619 05/16/20 0422  NA 122* 125* 127* 126* 123*  K 3.3* 4.2 3.5 3.1* 4.0  CL 90* 94* 95* 93* 91*  CO2 25 23 25 26 25   GLUCOSE 118* 100* 103* 100* 101*  BUN 10 9 9  6* 6*  CREATININE 0.72 0.67 0.57 0.57 0.50  CALCIUM 8.7* 8.5* 8.1* 7.7* 7.9*  MG 1.9  --   --   --  2.1   Liver Function Tests: No results for  input(s): AST, ALT, ALKPHOS, BILITOT, PROT, ALBUMIN in the last 168 hours. No results for input(s): LIPASE, AMYLASE in the last 168 hours. No results for input(s): AMMONIA in the last 168 hours. CBC: Recent Labs  Lab 05/12/20 1957 05/13/20 0427 05/14/20 0426 05/15/20 0619  WBC 19.3* 12.5* 10.4 7.5  HGB 12.0 11.0* 9.4* 8.5*  HCT 32.7* 32.0* 27.0* 23.8*  MCV 83.2 85.8 87.1 85.9  PLT 284 242 195 175   Cardiac Enzymes: No results for input(s): CKTOTAL, CKMB, CKMBINDEX, TROPONINI in the last 168 hours. BNP (last 3 results) No results for input(s): PROBNP in the last 8760 hours. CBG: No results for input(s): GLUCAP in the last 168 hours.  Recent Results (from the past 240 hour(s))  SARS Coronavirus 2 by RT PCR (hospital order, performed in [facility] hospital lab) Nasopharyngeal Nasopharyngeal Swab     Status: None   Collection Time: 05/12/20  6:25 PM   Specimen: Nasopharyngeal Swab  Result Value Ref Range Status   SARS Coronavirus 2 NEGATIVE NEGATIVE Final    Comment: (NOTE) SARS-CoV-2 target nucleic acids are NOT DETECTED.  The SARS-CoV-2 RNA is generally detectable in upper and lower respiratory specimens during the acute phase of infection. The lowest concentration of SARS-CoV-2 viral copies this assay can detect is 250 copies / mL. A negative result does not preclude SARS-CoV-2 infection and should not be used as the sole basis for treatment or other patient management decisions.  A negative result may occur with improper specimen collection / handling, submission of specimen other than nasopharyngeal swab, presence of viral mutation(s) within the areas targeted by this assay, and inadequate number of viral copies (<250 copies / mL). A negative result must be combined with clinical observations, patient history, and epidemiological information.  Fact Sheet for Patients:   [facility]  Fact Sheet for Healthcare  Providers: 05/14/20  This test is not yet approved or  cleared by the [employer] FDA and has been authorized for detection and/or diagnosis of SARS-CoV-2 by FDA under an Emergency Use Authorization (EUA).  This EUA will remain in effect (meaning this test can be used) for the duration of the COVID-19 declaration under Section 564(b)(1) of the Act, 21 U.S.C. section 360bbb-3(b)(1), unless the authorization is terminated or revoked sooner.  Performed at [facility], [address] [place], [address] [place]       Studies: No results found.   Scheduled Meds: . amLODipine  10 mg Oral Daily  . carvedilol  3.125 mg Oral BID  . Chlorhexidine Gluconate Cloth  6 each Topical Daily  . cyclobenzaprine  10 mg Oral TID  . docusate sodium  100 mg Oral BID  . enoxaparin (LOVENOX) injection  40 mg Subcutaneous Q24H  . ferrous fumarate-b12-vitamic C-folic acid  1 capsule Oral BID  . levothyroxine  125 mcg Oral Daily  . lisinopril  20 mg Oral Daily  . oxyCODONE  10 mg Oral 5 X Daily  .  pantoprazole  40 mg Oral Daily  . polyethylene glycol  17 g Oral Daily  . pravastatin  20 mg Oral Daily  . senna-docusate  1 tablet Oral BID   Continuous Infusions: . sodium chloride 75 mL/hr at 05/16/20 0102    Principal Problem:   Intertrochanteric fracture of right femur, closed, initial encounter [facility]) Active Problems:   Hypothyroidism   Hypertension   Hyperlipidemia   Chronic back pain   CAD (coronary artery disease)   Tobacco use     Mykah Bellomo Tublu Trisa Cranor, [facility]  If 7PM-7AM, please contact night-coverage [email or web address] Password [email or web address] 05/16/2020, 12:49 PM    LOS: 4 days

## 2020-05-16 NOTE — Progress Notes (Signed)
Physical Therapy Treatment Patient Details Name: Rebecca Peck MRN: [id number] DOB: [birth year] Today's Date: 05/16/2020    History of Present Illness 74 year old female who is household ambulator with a cane who has had trouble with being unsteady with her gait and suffered a fall and subsequent R hip fracture with ORIF IM nailing 6/30.    PT Comments    Patient easily woken, exhibited significant pain signs/symptoms immediate upon waking and throughout session. Pt assisted to sit EOB with maxA and HOB elevated, difficulty noted with upright posture, posterior lean despite multimodal cueing and reassurance of pt's fear of falling. MinA throughout sitting with 2-3 instances of CGA. Lateral scoot transfer attempted to recliner due to pt's continued difficulty with weight bearing/standing due to pain. Pt able to assist with buttocks clearance in preparation for lateral scooting, ulitmately maxA due to limitations because of pain. Educated on hand placement ea scoot. Pt up in chair, all needs in reach. The patient would benefit from further skilled PT intervention to progress towards goals. Recommendation remains appropriate.      Follow Up Recommendations  SNF;Supervision/Assistance - 24 hour     Equipment Recommendations  3in1 (PT)    Recommendations for Other Services       Precautions / Restrictions Precautions Precautions: Fall Restrictions Weight Bearing Restrictions: Yes RLE Weight Bearing: Weight bearing as tolerated    Mobility  Bed Mobility Overal bed mobility: Needs Assistance Bed Mobility: Supine to Sit     Supine to sit: Max assist;HOB elevated Sit to supine: Max assist;+2 for physical assistance   General bed mobility comments: Pt makes some effort in getting to EOB, but ultimately pain levels neccesitated heavy assist to rise to sitting  Transfers Overall transfer level: Needs assistance Equipment used: Rolling walker (2 wheeled) Transfers: Lateral/Scoot  Transfers Sit to Stand: Max assist;+2 physical assistance        Lateral/Scoot Transfers: Max assist General transfer comment: Pt able to assist with buttocks clearance in preparation for lateral scooting, ulitmately maxA due to limitations because of pain. Educated on hand placement ea scoot.  Ambulation/Gait             General Gait Details: deferred   Stairs             Wheelchair Mobility    Modified Rankin (Stroke Patients Only)       Balance Overall balance assessment: Needs assistance   Sitting balance-Leahy Scale: Poor Sitting balance - Comments: reliant on UE support and MinA throughout sitting except for a few instances of CGA   Standing balance support: Bilateral upper extremity supported Standing balance-Leahy Scale: Zero Standing balance comment: highly reliant on walker and assist from PT to maintain standing                            Cognition Arousal/Alertness: Awake/alert Behavior During Therapy: Anxious Overall Cognitive Status: Within Functional Limits for tasks assessed                                        Exercises General Exercises - Lower Extremity Ankle Circles/Pumps: AROM;10 reps Quad Sets: AROM;Strengthening;Right;10 reps Short Arc Quad: AAROM;Strengthening;Right;10 reps Heel Slides: AAROM;10 reps;Strengthening;Right;AROM;Left Hip ABduction/ADduction: Strengthening;AAROM;10 reps;AROM;Left    General Comments        Pertinent Vitals/Pain Pain Assessment: Faces Pain Score: 10-Worst pain ever Faces Pain Scale: Hurts worst Pain  Location: R hip and thigh Pain Descriptors / Indicators: Grimacing;Moaning;Guarding Pain Intervention(s): Limited activity within patient's tolerance;Monitored during session;Premedicated before session;Repositioned    Home Living                      Prior Function            PT Goals (current goals can now be found in the care plan section) Progress  towards PT goals:  (slowly)    Frequency    BID      PT Plan Current plan remains appropriate    Co-evaluation              AM-PAC PT "6 Clicks" Mobility   Outcome Measure  Help needed turning from your back to your side while in a flat bed without using bedrails?: A Lot Help needed moving from lying on your back to sitting on the side of a flat bed without using bedrails?: Total Help needed moving to and from a bed to a chair (including a wheelchair)?: Total Help needed standing up from a chair using your arms (e.g., wheelchair or bedside chair)?: Total Help needed to walk in hospital room?: Total Help needed climbing 3-5 steps with a railing? : Total 6 Click Score: 7    End of Session Equipment Utilized During Treatment: Gait belt Activity Tolerance: Patient limited by pain Patient left: with call bell/phone within reach;with family/visitor present;in chair;with chair alarm set;with SCD's reapplied Nurse Communication: Mobility status PT Visit Diagnosis: Muscle weakness (generalized) (M62.81);Difficulty in walking, not elsewhere classified (R26.2)     Time: 3557-3220 PT Time Calculation (min) (ACUTE ONLY): 51 min  Charges:  $Therapeutic Exercise: 38-52 mins                     Olga Coaster PT, DPT 2:38 PM,05/16/20

## 2020-05-17 ENCOUNTER — Inpatient Hospital Stay: Payer: Medicare HMO

## 2020-05-17 LAB — CBC
HCT: 24.2 % — ABNORMAL LOW (ref 36.0–46.0)
Hemoglobin: 8.2 g/dL — ABNORMAL LOW (ref 12.0–15.0)
MCH: 30.3 pg (ref 26.0–34.0)
MCHC: 33.9 g/dL (ref 30.0–36.0)
MCV: 89.3 fL (ref 80.0–100.0)
Platelets: 244 10*3/uL (ref 150–400)
RBC: 2.71 MIL/uL — ABNORMAL LOW (ref 3.87–5.11)
RDW: 14.6 % (ref 11.5–15.5)
WBC: 10.2 10*3/uL (ref 4.0–10.5)
nRBC: 0 % (ref 0.0–0.2)

## 2020-05-17 LAB — BASIC METABOLIC PANEL
Anion gap: 8 (ref 5–15)
BUN: 9 mg/dL (ref 8–23)
CO2: 25 mmol/L (ref 22–32)
Calcium: 7.9 mg/dL — ABNORMAL LOW (ref 8.9–10.3)
Chloride: 93 mmol/L — ABNORMAL LOW (ref 98–111)
Creatinine, Ser: 0.68 mg/dL (ref 0.44–1.00)
GFR calc Af Amer: >60 mL/min (ref >60)
GFR calc non Af Amer: >60 mL/min (ref >60)
Glucose, Bld: 108 mg/dL — ABNORMAL HIGH (ref 70–99)
Potassium: 4.1 mmol/L (ref 3.5–5.1)
Sodium: 126 mmol/L — ABNORMAL LOW (ref 135–145)

## 2020-05-17 LAB — OSMOLALITY: Osmolality: 260 mOsm/kg — ABNORMAL LOW (ref 275–295)

## 2020-05-17 MED ORDER — IOHEXOL 9 MG/ML PO SOLN
500.0000 mL | ORAL | Status: AC
  Administered 2020-05-17 (×2): 500 mL via ORAL

## 2020-05-17 MED ORDER — METHOCARBAMOL 1000 MG/10ML IJ SOLN
500.0000 mg | Freq: Four times a day (QID) | INTRAVENOUS | Status: DC | PRN
  Administered 2020-05-18: 500 mg via INTRAVENOUS
  Filled 2020-05-17: qty 5

## 2020-05-17 NOTE — Plan of Care (Signed)
Pt pain not well-controlled during the night. Dilaudid 1mg  given. Pt resting comfortably at this time. Problem: Education: Goal: Knowledge of General Education information will improve Description: Including pain rating scale, medication(s)/side effects and non-pharmacologic comfort measures Outcome: Progressing   Problem: Health Behavior/Discharge Planning: Goal: Ability to manage health-related needs will improve Outcome: Progressing   Problem: Clinical Measurements: Goal: Ability to maintain clinical measurements within normal limits will improve Outcome: Progressing Goal: Will remain free from infection Outcome: Progressing Goal: Diagnostic test results will improve Outcome: Progressing Goal: Respiratory complications will improve Outcome: Progressing Goal: Cardiovascular complication will be avoided Outcome: Progressing   Problem: Activity: Goal: Risk for activity intolerance will decrease Outcome: Progressing   Problem: Nutrition: Goal: Adequate nutrition will be maintained Outcome: Progressing   Problem: Coping: Goal: Level of anxiety will decrease Outcome: Progressing   Problem: Elimination: Goal: Will not experience complications related to bowel motility Outcome: Progressing Goal: Will not experience complications related to urinary retention Outcome: Progressing   Problem: Pain Managment: Goal: General experience of comfort will improve Outcome: Progressing   Problem: Safety: Goal: Ability to remain free from injury will improve Outcome: Progressing   Problem: Skin Integrity: Goal: Risk for impaired skin integrity will decrease Outcome: Progressing

## 2020-05-17 NOTE — Progress Notes (Signed)
  Subjective: 4 Days Post-Op Procedure(s) (LRB): INTRAMEDULLARY (IM) NAIL FEMORAL (Right) Patient reports pain as severe.   Plan is to go Skilled nursing facility after hospital stay. Negative for chest pain and shortness of breath Fever: no Gastrointestinal: positive for nausea, no vomiting.   Patient has not had a bowel movement in 5 days.  Objective: Vital signs in last 24 hours: Temp:  [98.3 F (36.8 C)-100.3 F (37.9 C)] 98.7 F (37.1 C) (07/04 0722) Pulse Rate:  [86-98] 86 (07/04 0722) Resp:  [17-18] 17 (07/04 0722) BP: (134-156)/(75-121) 156/87 (07/04 0722) SpO2:  [90 %-100 %] 100 % (07/04 0722)  Intake/Output from previous day:  Intake/Output Summary (Last 24 hours) at 05/17/2020 1024 Last data filed at 05/17/2020 0945 Gross per 24 hour  Intake 480 ml  Output 300 ml  Net 180 ml    Intake/Output this shift: No intake/output data recorded.  Labs: Recent Labs    05/15/20 0619 05/17/20 0426  HGB 8.5* 8.2*   Recent Labs    05/15/20 0619 05/17/20 0426  WBC 7.5 10.2  RBC 2.77* 2.71*  HCT 23.8* 24.2*  PLT 175 244   Recent Labs    05/16/20 0422 05/17/20 0426  NA 123* 126*  K 4.0 4.1  CL 91* 93*  CO2 25 25  BUN 6* 9  CREATININE 0.50 0.68  GLUCOSE 101* 108*  CALCIUM 7.9* 7.9*   No results for input(s): LABPT, INR in the last 72 hours.   EXAM General - Patient is Alert, Appropriate and Oriented Extremity - Neurovascular intact Dorsiflexion/Plantar flexion intact Compartment soft Dressing/Incision - proximal and distal honeycombs show no drainage, mild to moderate sanguinous drainage noted from middle dressing Motor Function - intact, moving foot and toes well on exam.  Gastrointestinal- nontender, distended and hypoactive bowel sounds   Assessment/Plan: 4 Days Post-Op Procedure(s) (LRB): INTRAMEDULLARY (IM) NAIL FEMORAL (Right) Principal Problem:   Intertrochanteric fracture of right femur, closed, initial encounter (HCC) Active Problems:    Hypothyroidism   Hypertension   Hyperlipidemia   Chronic back pain   CAD (coronary artery disease)   Tobacco use  Estimated body mass index is 19.69 kg/m as calculated from the following:   Height as of this encounter: 5\' 6"  (1.676 m).   Weight as of this encounter: 55.3 kg. Up with therapy  Middle dressing changed, fresh honeycomb applied.  DVT Prophylaxis - Lovenox, Ted hose and SCDs Weight-Bearing as tolerated to right leg  , PA-C [facility] 05/17/2020, 10:24 AM

## 2020-05-17 NOTE — Progress Notes (Signed)
PROGRESS NOTE    Rebecca Peck  AST:[id number]  DOB: 27-Apr-1946  DOA: 05/12/2020 PCP: Patient, No Pcp Per Outpatient Specialists:   Hospital course:  Rebecca Prest Pickardis a 74 y.o.femalewith medical history significant forCADwith remote stent placement, hypertension, hyperlipidemia, hypothyroidism, GERD,tobacco use,and chronic back pain who was admitted 05/12/2020 with right hip fracture status post mechanical fall.   Subjective:  Patient without any new complaints other than continuing to feel sleepy and generally uncomfortable.  When I note that her belly has gotten bigger and is firm patient agrees that she has not had a bowel movement yet.  Patient admits to ongoing intermittent nausea.  Note she is not eating very much.  No vomiting.  Denies abdominal pain per se but notes it is uncomfortable when I palpated.   Objective: Vitals:   05/16/20 2010 05/17/20 0035 05/17/20 0141 05/17/20 0722  BP:  (!) 134/121 136/75 (!) 156/87  Pulse:  98 94 86  Resp:  18 17 17   Temp: 99.3 F (37.4 C) 99.7 F (37.6 C) 99.2 F (37.3 C) 98.7 F (37.1 C)  TempSrc: Oral Oral Oral Oral  SpO2:  90% 91% 100%  Weight:      Height:        Intake/Output Summary (Last 24 hours) at 05/17/2020 1431 Last data filed at 05/17/2020 1300 Gross per 24 hour  Intake 480 ml  Output 800 ml  Net -320 ml   Filed Weights   05/12/20 1551 05/13/20 1420  Weight: 55.3 kg 55.3 kg     Exam:  General: Very sleepy female lying in bed in no acute distress with husband at bedside.   Eyes: sclera anicteric, conjuctiva mild injection bilaterally CVS: S1-S2, regular  Respiratory:  decreased air entry bilaterally secondary to decreased inspiratory effort, rales at bases  GI: Patient with present but diminished bowel sounds.  Abdomen is much more distended this morning than it was yesterday.  Firm but not hard.  Not really tender to palpation but patient indicates that it is generally uncomfortable.  No  rebound tenderness. LE: No edema.  Neuro: grossly nonfocal.    Assessment & Plan:   Distended abdomen Abdomen has gotten increasingly distended over the past couple of days. Belly films done today showed distention of small bowel and colon consistent with SBO versus ileus Will order CT abdomen with oral contrast now No indication for NG tube at present as she is not vomiting Will place patient on clear liquid diet and hold solid food until we have a better understanding of what is going on in her belly.  Right hip fracture.   Secondary to mechanical fall. POD #4 status post intramedullary femoral nail on right. PT OT per hip fracture protocol Plan was for discharge to [facility] on Monday barring complications although what appears to be impending ileus may well delay discharge.  Chronic hyponatremia.   Sodium is back to her baseline at 126  Serum osmolality is low at 260 with normal urine osmolality and sodium Patient has some level of SIADH. Can consider fluid restriction once we have sorted out ileus/SBO. At present she is continue on normal saline at 75 cc an hour.  Constipation MiraLAX was added yesterday, will add Dulcolax suppository x1 today.  Hypokalemia Normalized with repletion.  History of CAD.   S/p PCI many years ago.  No chest pain. Continue carvedilol and pravastatin.  Hypertension.   Under reasonable control on home doses of carvedilol, amlodipine, and lisinopril  Hyperlipidemia: Continue pravastatin  Hypothyroidism: Continue Synthroid.  Chronic back pain: Continue home oxycodone in addition to the as needed pain medication she is getting for her hip fracture.3   DVT prophylaxis: SCD, Lovenox to be restarted per orthopedics Code Status: Full Family Communication: Son was at bedside throughout Disposition Plan:   Patient is from: Home  Anticipated Discharge Location: Rehab  Barriers to Discharge: Seems to be developing SBO/ileus.  She [facility]  [facility] if she is stable to go.   Is patient medically stable for Discharge: No   Consultants:  Orthopedics  Procedures:  Status post ORIF 05/13/2020  Antimicrobials:  None   Data Reviewed:  Basic Metabolic Panel: Recent Labs  Lab 05/12/20 1957 05/12/20 1957 05/13/20 0427 05/14/20 0426 05/15/20 0619 05/16/20 0422 05/17/20 0426  NA 122*   < > 125* 127* 126* 123* 126*  K 3.3*   < > 4.2 3.5 3.1* 4.0 4.1  CL 90*   < > 94* 95* 93* 91* 93*  CO2 25   < > 23 25 26 25 25   GLUCOSE 118*   < > 100* 103* 100* 101* 108*  BUN 10   < > 9 9 6* 6* 9  CREATININE 0.72   < > 0.67 0.57 0.57 0.50 0.68  CALCIUM 8.7*   < > 8.5* 8.1* 7.7* 7.9* 7.9*  MG 1.9  --   --   --   --  2.1  --    < > = values in this interval not displayed.   Liver Function Tests: No results for input(s): AST, ALT, ALKPHOS, BILITOT, PROT, ALBUMIN in the last 168 hours. No results for input(s): LIPASE, AMYLASE in the last 168 hours. No results for input(s): AMMONIA in the last 168 hours. CBC: Recent Labs  Lab 05/12/20 1957 05/13/20 0427 05/14/20 0426 05/15/20 0619 05/17/20 0426  WBC 19.3* 12.5* 10.4 7.5 10.2  HGB 12.0 11.0* 9.4* 8.5* 8.2*  HCT 32.7* 32.0* 27.0* 23.8* 24.2*  MCV 83.2 85.8 87.1 85.9 89.3  PLT 284 242 195 175 244   Cardiac Enzymes: No results for input(s): CKTOTAL, CKMB, CKMBINDEX, TROPONINI in the last 168 hours. BNP (last 3 results) No results for input(s): PROBNP in the last 8760 hours. CBG: No results for input(s): GLUCAP in the last 168 hours.  Recent Results (from the past 240 hour(s))  SARS Coronavirus 2 by RT PCR (hospital order, performed in [facility] hospital lab) Nasopharyngeal Nasopharyngeal Swab     Status: None   Collection Time: 05/12/20  6:25 PM   Specimen: Nasopharyngeal Swab  Result Value Ref Range Status   SARS Coronavirus 2 NEGATIVE NEGATIVE Final    Comment: (NOTE) SARS-CoV-2 target nucleic acids are NOT DETECTED.  The  SARS-CoV-2 RNA is generally detectable in upper and lower respiratory specimens during the acute phase of infection. The lowest concentration of SARS-CoV-2 viral copies this assay can detect is 250 copies / mL. A negative result does not preclude SARS-CoV-2 infection and should not be used as the sole basis for treatment or other patient management decisions.  A negative result may occur with improper specimen collection / handling, submission of specimen other than nasopharyngeal swab, presence of viral mutation(s) within the areas targeted by this assay, and inadequate number of viral copies (<250 copies / mL). A negative result must be combined with clinical observations, patient history, and epidemiological information.  Fact Sheet for Patients:   05/14/20  Fact Sheet for Healthcare Providers: BoilerBrush.com.cy  This test is  not yet approved or  cleared by the [place] and has been authorized for detection and/or diagnosis of SARS-CoV-2 by FDA under an Emergency Use Authorization (EUA).  This EUA will remain in effect (meaning this test can be used) for the duration of the COVID-19 declaration under Section 564(b)(1) of the Act, 21 U.S.C. section 360bbb-3(b)(1), unless the authorization is terminated or revoked sooner.  Performed at [facility], [address] [place], [place] [address]       Studies: DG Abd 2 Views  Result Date: 05/17/2020 CLINICAL DATA:  74 year old female with nausea EXAM: ABDOMEN - 2 VIEW COMPARISON:  Pelvic plain film 05/12/2020 FINDINGS: Gaseous distension of small bowel and colon. No distention on the prior pelvic plain film of 05/12/2020. Osteopenia with degenerative changes of the spine and bilateral hips. Surgical changes of the right hip incompletely imaged. IMPRESSION: Distension of small bowel and colon, either postoperative ileus or obstruction. CT imaging is indicated if  there is concern for obstruction. Electronically Signed   By: Gilmer Mor D.O.   On: 05/17/2020 13:48     Scheduled Meds: . amLODipine  10 mg Oral Daily  . carvedilol  3.125 mg Oral BID  . Chlorhexidine Gluconate Cloth  6 each Topical Daily  . cyclobenzaprine  10 mg Oral TID  . docusate sodium  100 mg Oral BID  . enoxaparin (LOVENOX) injection  40 mg Subcutaneous Q24H  . ferrous fumarate-b12-vitamic C-folic acid  1 capsule Oral BID  . levothyroxine  125 mcg Oral Daily  . lisinopril  20 mg Oral Daily  . oxyCODONE  10 mg Oral 5 X Daily  . pantoprazole  40 mg Oral Daily  . polyethylene glycol  17 g Oral Daily  . pravastatin  20 mg Oral Daily  . senna-docusate  1 tablet Oral BID   Continuous Infusions: . sodium chloride 75 mL/hr at 05/17/20 0227    Principal Problem:   Intertrochanteric fracture of right femur, closed, initial encounter [facility]) Active Problems:   Hypothyroidism   Hypertension   Hyperlipidemia   Chronic back pain   CAD (coronary artery disease)   Tobacco use     Arian Mcquitty Tublu Celeste Candelas, [facility]  If 7PM-7AM, please contact night-coverage [email or web address] Password [email or web address] 05/17/2020, 2:31 PM    LOS: 5 days

## 2020-05-18 ENCOUNTER — Inpatient Hospital Stay: Payer: Medicare HMO

## 2020-05-18 LAB — COMPREHENSIVE METABOLIC PANEL
ALT: 14 U/L (ref 0–44)
AST: 24 U/L (ref 15–41)
Albumin: 2.5 g/dL — ABNORMAL LOW (ref 3.5–5.0)
Alkaline Phosphatase: 68 U/L (ref 38–126)
Anion gap: 9 (ref 5–15)
BUN: 7 mg/dL — ABNORMAL LOW (ref 8–23)
CO2: 22 mmol/L (ref 22–32)
Calcium: 7.6 mg/dL — ABNORMAL LOW (ref 8.9–10.3)
Chloride: 93 mmol/L — ABNORMAL LOW (ref 98–111)
Creatinine, Ser: 0.47 mg/dL (ref 0.44–1.00)
GFR calc Af Amer: >60 mL/min (ref >60)
GFR calc non Af Amer: >60 mL/min (ref >60)
Glucose, Bld: 92 mg/dL (ref 70–99)
Potassium: 3.3 mmol/L — ABNORMAL LOW (ref 3.5–5.1)
Sodium: 124 mmol/L — ABNORMAL LOW (ref 135–145)
Total Bilirubin: 1.1 mg/dL (ref 0.3–1.2)
Total Protein: 6 g/dL — ABNORMAL LOW (ref 6.5–8.1)

## 2020-05-18 LAB — IRON AND TIBC
Iron: 21 ug/dL — ABNORMAL LOW (ref 28–170)
Saturation Ratios: 10 % — ABNORMAL LOW (ref 10.4–31.8)
TIBC: 221 ug/dL — ABNORMAL LOW (ref 250–450)
UIBC: 200 ug/dL

## 2020-05-18 LAB — FERRITIN: Ferritin: 97 ng/mL (ref 11–307)

## 2020-05-18 LAB — CBC
HCT: 23.2 % — ABNORMAL LOW (ref 36.0–46.0)
Hemoglobin: 8.1 g/dL — ABNORMAL LOW (ref 12.0–15.0)
MCH: 29.9 pg (ref 26.0–34.0)
MCHC: 34.9 g/dL (ref 30.0–36.0)
MCV: 85.6 fL (ref 80.0–100.0)
Platelets: 258 10*3/uL (ref 150–400)
RBC: 2.71 MIL/uL — ABNORMAL LOW (ref 3.87–5.11)
RDW: 14.7 % (ref 11.5–15.5)
WBC: 9.8 10*3/uL (ref 4.0–10.5)
nRBC: 0 % (ref 0.0–0.2)

## 2020-05-18 LAB — SARS CORONAVIRUS 2 (TAT 6-24 HRS): SARS Coronavirus 2: NEGATIVE

## 2020-05-18 MED ORDER — POTASSIUM CHLORIDE CRYS ER 20 MEQ PO TBCR
40.0000 meq | EXTENDED_RELEASE_TABLET | Freq: Once | ORAL | Status: AC
  Administered 2020-05-18: 40 meq via ORAL
  Filled 2020-05-18: qty 2

## 2020-05-18 MED ORDER — FLEET ENEMA 7-19 GM/118ML RE ENEM
1.0000 | ENEMA | Freq: Once | RECTAL | Status: AC
  Administered 2020-05-18: 1 via RECTAL

## 2020-05-18 NOTE — Progress Notes (Addendum)
PROGRESS NOTE    Rebecca Peck  NTI:[id number] DOB: 1946-09-06 DOA: 05/12/2020 PCP: Patient, No Pcp Per   Brief Narrative:  Rebecca Prevost Pickardis a 74 y.o.femalewith medical history significant forCADwith remote stent placement, hypertension, hyperlipidemia, hypothyroidism, GERD,tobacco use,and chronic back pain who was admitted 05/12/2020 with right hip fracture status post mechanical fall s/p ORIF.  Developed anorexia, nausea and vomiting postoperatively, some concern of ileus but CT abdomen was negative.  Subjective: Patient continued to have some nausea without any vomiting.  No bowel movement yet.  Husband and son were in the room.  Assessment & Plan:   Principal Problem:   Intertrochanteric fracture of right femur, closed, initial encounter (HCC) Active Problems:   Hypothyroidism   Hypertension   Hyperlipidemia   Chronic back pain   CAD (coronary artery disease)   Tobacco use  Distended abdomen/constipation.  Patient has soft distended abdomen.  CT abdomen done yesterday was without any sign of small bowel obstruction or ileus.  Still no BM with good bowel regimen.  Mild nausea without any vomiting and continued to have anorexia. -Advance diet to full liquid if able to tolerate. -Try Dulcolax suppository-we can try Fleet enema if unsuccessful.  Right hip fracture.  Secondary to mechanical fall.  POD day 5. S/p intramedullary nail placement. PT/OT recommending SNF-should be able to go tomorrow if constipation resolves and patient able to tolerate p.o. intake.  Chronic hyponatremia. Sodium at 124 with baseline around 126.  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 -We can consider fluid restriction once start taking p.o.  Currently at a maintenance fluid of normal saline at 75 cc an hour.  Hypokalemia.  Potassium at 3.3 today -Replete electrolytes as needed. -Check magnesium.  Anemia secondary to surgical blood loss.  Hemoglobin at 8.1 today. -Start her on  iron supplement. -Continue to monitor  Hypertension.  Blood pressure within goal.   -Continue home dose of carvedilol, amlodipine and lisinopril.  Hyperlipidemia: Continue pravastatin  Hypothyroidism: Continue Synthroid.  Chronic back pain: Continue home oxycodone in addition to the as needed pain medication she is getting for her hip fracture.  Objective: Vitals:   05/17/20 0722 05/17/20 1517 05/17/20 2314 05/18/20 0802  BP: (!) 156/87 130/80 (!) 142/76 (!) 141/74  Pulse: 86 89 89 89  Resp: 17 18 18 16   Temp: 98.7 F (37.1 C) 98.4 F (36.9 C) 98.4 F (36.9 C) 97.9 F (36.6 C)  TempSrc: Oral Oral Oral Oral  SpO2: 100% 97% 98% 96%  Weight:      Height:        Intake/Output Summary (Last 24 hours) at 05/18/2020 1441 Last data filed at 05/18/2020 1039 Gross per 24 hour  Intake 3402.56 ml  Output 1800 ml  Net 1602.56 ml   Filed Weights   05/12/20 1551 05/13/20 1420  Weight: 55.3 kg 55.3 kg    Examination:  General exam: Appears calm and comfortable  Respiratory system: Clear to auscultation. Respiratory effort normal. Cardiovascular system: S1 & S2 heard, RRR. No JVD, murmurs, Gastrointestinal system: Soft, nontender, mildly distended, bowel sounds positive. Central nervous system: Alert and oriented. No focal neurological deficits.Symmetric 5 x 5 power. Extremities: No edema, no cyanosis, pulses intact and symmetrical. Psychiatry: Judgement and insight appear normal. Mood & affect appropriate.    DVT prophylaxis: Lovenox Code Status: Full Family Communication: Husband and son were updated at bedside Disposition Plan:  Status is: Inpatient  Remains inpatient appropriate because:Inpatient level of care appropriate due to severity of illness   Dispo:  The patient is from: Home              Anticipated d/c is to: SNF              Anticipated d/c date is: 1 day              Patient currently is not medically stable to d/c.  Consultants:    Orthopedic  Procedures:  Antimicrobials:   Data Reviewed: I have personally reviewed following labs and imaging studies  CBC: Recent Labs  Lab 05/13/20 0427 05/14/20 0426 05/15/20 0619 05/17/20 0426 05/18/20 0701  WBC 12.5* 10.4 7.5 10.2 9.8  HGB 11.0* 9.4* 8.5* 8.2* 8.1*  HCT 32.0* 27.0* 23.8* 24.2* 23.2*  MCV 85.8 87.1 85.9 89.3 85.6  PLT 242 195 175 244 258   Basic Metabolic Panel: Recent Labs  Lab 05/12/20 1957 05/13/20 0427 05/14/20 0426 05/15/20 0619 05/16/20 0422 05/17/20 0426 05/18/20 0701  NA 122*   < > 127* 126* 123* 126* 124*  K 3.3*   < > 3.5 3.1* 4.0 4.1 3.3*  CL 90*   < > 95* 93* 91* 93* 93*  CO2 25   < > 25 26 25 25 22   GLUCOSE 118*   < > 103* 100* 101* 108* 92  BUN 10   < > 9 6* 6* 9 7*  CREATININE 0.72   < > 0.57 0.57 0.50 0.68 0.47  CALCIUM 8.7*   < > 8.1* 7.7* 7.9* 7.9* 7.6*  MG 1.9  --   --   --  2.1  --   --    < > = values in this interval not displayed.   GFR: Estimated Creatinine Clearance: 53.9 mL/min (by C-G formula based on SCr of 0.47 mg/dL). Liver Function Tests: Recent Labs  Lab 05/18/20 0701  AST 24  ALT 14  ALKPHOS 68  BILITOT 1.1  PROT 6.0*  ALBUMIN 2.5*   No results for input(s): LIPASE, AMYLASE in the last 168 hours. No results for input(s): AMMONIA in the last 168 hours. Coagulation Profile: Recent Labs  Lab 05/12/20 1957  INR 1.0   Cardiac Enzymes: No results for input(s): CKTOTAL, CKMB, CKMBINDEX, TROPONINI in the last 168 hours. BNP (last 3 results) No results for input(s): PROBNP in the last 8760 hours. HbA1C: No results for input(s): HGBA1C in the last 72 hours. CBG: No results for input(s): GLUCAP in the last 168 hours. Lipid Profile: No results for input(s): CHOL, HDL, LDLCALC, TRIG, CHOLHDL, LDLDIRECT in the last 72 hours. Thyroid Function Tests: No results for input(s): TSH, T4TOTAL, FREET4, T3FREE, THYROIDAB in the last 72 hours. Anemia Panel: No results for input(s): VITAMINB12, FOLATE,  FERRITIN, TIBC, IRON, RETICCTPCT in the last 72 hours. Sepsis Labs: No results for input(s): PROCALCITON, LATICACIDVEN in the last 168 hours.  Recent Results (from the past 240 hour(s))  SARS Coronavirus 2 by RT PCR (hospital order, performed in [facility] hospital lab) Nasopharyngeal Nasopharyngeal Swab     Status: None   Collection Time: 05/12/20  6:25 PM   Specimen: Nasopharyngeal Swab  Result Value Ref Range Status   SARS Coronavirus 2 NEGATIVE NEGATIVE Final    Comment: (NOTE) SARS-CoV-2 target nucleic acids are NOT DETECTED.  The SARS-CoV-2 RNA is generally detectable in upper and lower respiratory specimens during the acute phase of infection. The lowest concentration of SARS-CoV-2 viral copies this assay can detect is 250 copies / mL. A negative result does not preclude SARS-CoV-2 infection and should not be  used as the sole basis for treatment or other patient management decisions.  A negative result may occur with improper specimen collection / handling, submission of specimen other than nasopharyngeal swab, presence of viral mutation(s) within the areas targeted by this assay, and inadequate number of viral copies (<250 copies / mL). A negative result must be combined with clinical observations, patient history, and epidemiological information.  Fact Sheet for Patients:   BoilerBrush.com.cyhttps://www.fda.gov/media/136312/download  Fact Sheet for Healthcare Providers: https://pope.com/https://www.fda.gov/media/136313/download  This test is not yet approved or  cleared by the [place] FDA and has been authorized for detection and/or diagnosis of SARS-CoV-2 by FDA under an Emergency Use Authorization (EUA).  This EUA will remain in effect (meaning this test can be used) for the duration of the COVID-19 declaration under Section 564(b)(1) of the Act, 21 U.S.C. section 360bbb-3(b)(1), unless the authorization is terminated or revoked sooner.  Performed at [facility], [address] [place], [place] [address]      Radiology Studies: CT ABDOMEN PELVIS WO CONTRAST  Result Date: 05/17/2020 CLINICAL DATA:  Abdominal distension EXAM: CT ABDOMEN AND PELVIS WITHOUT CONTRAST TECHNIQUE: Multidetector CT imaging of the abdomen and pelvis was performed following the standard protocol without IV contrast. COMPARISON:  None. FINDINGS: Lower chest: Mild atelectasis is seen within the posterior aspect of the bilateral lung bases. Hepatobiliary: No focal liver abnormality is seen. Status post cholecystectomy. No biliary dilatation. Pancreas: Unremarkable. No pancreatic ductal dilatation or surrounding inflammatory changes. Spleen: Normal in size without focal abnormality. Adrenals/Urinary Tract: Adrenal glands are unremarkable. The right kidney is atrophic in appearance. Mild compensatory hypertrophy of the left kidney is seen. No focal lesions are identified. There is mild right-sided perinephric inflammatory fat stranding. Mild left-sided hydronephrosis and proximal hydroureter are seen. A small amount of air is seen within the lumen of an otherwise moderately distended urinary bladder. Stomach/Bowel: A small hiatal hernia is seen. The appendix is not clearly identified. Oral contrast is seen throughout the small bowel which is normal in caliber. Air is seen throughout the course of the large bowel, without evidence of stool. Vascular/Lymphatic: There is marked severity calcification of the abdominal aorta with approximately 4.4 cm x 4.3 cm aneurysmal dilatation of the infrarenal abdominal aorta. No enlarged abdominal or pelvic lymph nodes. Reproductive: Status post hysterectomy. No adnexal masses. Other: No abdominal wall hernia or abnormality. No abdominopelvic ascites. Musculoskeletal: A mild amount of intramuscular air is seen along the lateral right pelvic wall. A metallic density intramedullary rod and compression screw device are seen within the proximal right femur. Marked severity degenerative  changes seen within the mid and lower lumbar spine. IMPRESSION: 1. 4.4 cm x 4.3 cm infrarenal abdominal aortic aneurysm. 2. Small hiatal hernia. 3. Evidence of prior cholecystectomy and hysterectomy. 4. Minimal stool burden without evidence of bowel obstruction. 5. Prior open reduction and internal fixation of the proximal right femur with postoperative changes suspected within the soft tissues along the right pelvic wall. Aortic Atherosclerosis (ICD10-I70.0). Electronically Signed   By: Aram Candelahaddeus  Houston M.D.   On: 05/17/2020 18:24   DG Abd 2 Views  Result Date: 05/17/2020 CLINICAL DATA:  74 year old female with nausea EXAM: ABDOMEN - 2 VIEW COMPARISON:  Pelvic plain film 05/12/2020 FINDINGS: Gaseous distension of small bowel and colon. No distention on the prior pelvic plain film of 05/12/2020. Osteopenia with degenerative changes of the spine and bilateral hips. Surgical changes of the right hip incompletely imaged. IMPRESSION: Distension of small bowel and colon, either postoperative ileus  or obstruction. CT imaging is indicated if there is concern for obstruction. Electronically Signed   By: Gilmer Mor D.O.   On: 05/17/2020 13:48    Scheduled Meds: . amLODipine  10 mg Oral Daily  . carvedilol  3.125 mg Oral BID  . Chlorhexidine Gluconate Cloth  6 each Topical Daily  . cyclobenzaprine  10 mg Oral TID  . docusate sodium  100 mg Oral BID  . enoxaparin (LOVENOX) injection  40 mg Subcutaneous Q24H  . ferrous fumarate-b12-vitamic C-folic acid  1 capsule Oral BID  . levothyroxine  125 mcg Oral Daily  . lisinopril  20 mg Oral Daily  . oxyCODONE  10 mg Oral 5 X Daily  . pantoprazole  40 mg Oral Daily  . polyethylene glycol  17 g Oral Daily  . pravastatin  20 mg Oral Daily  . senna-docusate  1 tablet Oral BID   Continuous Infusions: . sodium chloride 75 mL/hr at 05/18/20 0611  . methocarbamol (ROBAXIN) IV 500 mg (05/18/20 0132)     LOS: 6 days   Time spent: 40 minutes.  Arnetha Courser,  MD [facility]  If 7PM-7AM, please contact night-coverage [email or web address]  05/18/2020, 2:41 PM   This record has been created using Conservation officer, historic buildings. Errors have been sought and corrected,but may not always be located. Such creation errors do not reflect on the standard of care.

## 2020-05-18 NOTE — Progress Notes (Signed)
  Subjective: 5 Days Post-Op Procedure(s) (LRB): INTRAMEDULLARY (IM) NAIL FEMORAL (Right) Patient's son and husband at bedside. Son states that patient is having difficulty in PT knowing where her knee is/if she is moving it.  Patient reports pain as moderate.   Patient is well, but has had some minor complaints of continued nausea withotu vomiting. Plan is to go Skilled nursing facility after hospital stay. Negative for chest pain and shortness of breath Fever: no Patient has not had a bowel movement.  Objective: Vital signs in last 24 hours: Temp:  [97.9 F (36.6 C)-98.4 F (36.9 C)] 97.9 F (36.6 C) (07/05 0802) Pulse Rate:  [89] 89 (07/05 0802) Resp:  [16-18] 16 (07/05 0802) BP: (130-142)/(74-80) 141/74 (07/05 0802) SpO2:  [96 %-98 %] 96 % (07/05 0802)  Intake/Output from previous day:  Intake/Output Summary (Last 24 hours) at 05/18/2020 1138 Last data filed at 05/18/2020 1039 Gross per 24 hour  Intake 3402.56 ml  Output 1800 ml  Net 1602.56 ml    Intake/Output this shift: Total I/O In: 0  Out: 600 [Urine:600]  Labs: Recent Labs    05/17/20 0426 05/18/20 0701  HGB 8.2* 8.1*   Recent Labs    05/17/20 0426 05/18/20 0701  WBC 10.2 9.8  RBC 2.71* 2.71*  HCT 24.2* 23.2*  PLT 244 258   Recent Labs    05/17/20 0426 05/18/20 0701  NA 126* 124*  K 4.1 3.3*  CL 93* 93*  CO2 25 22  BUN 9 7*  CREATININE 0.68 0.47  GLUCOSE 108* 92  CALCIUM 7.9* 7.6*   No results for input(s): LABPT, INR in the last 72 hours.   EXAM General - Patient is Alert, Appropriate and Oriented Extremity - Neurovascular intact but with decreased sensation to light touch over the superficial fibular and deep fibular distributions Compartment soft  decrease in dorsiflexion, but able to perform Dressing/Incision -clean, dry, no drainage Motor Function - intact, moving toes on exam   Assessment/Plan: 5 Days Post-Op Procedure(s) (LRB): INTRAMEDULLARY (IM) NAIL FEMORAL  (Right) Principal Problem:   Intertrochanteric fracture of right femur, closed, initial encounter (HCC) Active Problems:   Hypothyroidism   Hypertension   Hyperlipidemia   Chronic back pain   CAD (coronary artery disease)   Tobacco use  Estimated body mass index is 19.69 kg/m as calculated from the following:   Height as of this encounter: 5\' 6"  (1.676 m).   Weight as of this encounter: 55.3 kg. Up with therapy    DVT Prophylaxis - Lovenox, Ted hose and SCDs Weight-Bearing as tolerated to right leg  , PA-C [facility] 05/18/2020, 11:38 AM

## 2020-05-18 NOTE — Progress Notes (Signed)
Physical Therapy Treatment Patient Details Name: Rebecca Peck MRN: [id number] DOB: June 25, 1946 Today's Date: 05/18/2020    History of Present Illness 74 year old female who is household ambulator with a cane who has had trouble with being unsteady with her gait and suffered a fall and subsequent R hip fracture with ORIF IM nailing 6/30.    PT Comments    Pt alert, eager to return to bed, exhibited mod pain signs/symptoms, seemed to have less pain this session. Sit <> stand with RW and maxAx2, and stand pivot maxAx2. Pt did experience episode of buckling, totalA to correct and return pt to fully standing. Sit to supine with modAx2 for safety. Pt able to perform several exercises, improved muscle activation noted. The patient would benefit from further skilled PT intervention to continue to progress towards goals. Recommendation remains appropriate.    Follow Up Recommendations  SNF;Supervision/Assistance - 24 hour     Equipment Recommendations  3in1 (PT)    Recommendations for Other Services       Precautions / Restrictions Precautions Precautions: Fall Restrictions Weight Bearing Restrictions: Yes RLE Weight Bearing: Weight bearing as tolerated    Mobility  Bed Mobility Overal bed mobility: Needs Assistance Bed Mobility: Sit to Supine     Supine to sit: Mod assist;HOB elevated Sit to supine: Mod assist;+2 for safety/equipment   General bed mobility comments: extensive use of bed rails, improved ability to assist with transfer today  Transfers Overall transfer level: Needs assistance Equipment used: Rolling walker (2 wheeled) Transfers: Sit to/from [employer] Transfers Sit to Stand: Max assist;+2 physical assistance;From elevated surface Stand pivot transfers: Max assist;+2 physical assistance       General transfer comment: Pt experienced buckling, totalA to address, but once balanced returned to maxAx2  Ambulation/Gait             General Gait  Details: deferred   Stairs             Wheelchair Mobility    Modified Rankin (Stroke Patients Only)       Balance Overall balance assessment: Needs assistance   Sitting balance-Leahy Scale: Poor Sitting balance - Comments: reliant on UE support and MinA throughout sitting except for a few instances of CGA   Standing balance support: Bilateral upper extremity supported Standing balance-Leahy Scale: Zero Standing balance comment: highly reliant on walker and assist from PT to maintain standing                            Cognition Arousal/Alertness: Awake/alert Behavior During Therapy: Anxious Overall Cognitive Status: Within Functional Limits for tasks assessed                                        Exercises General Exercises - Lower Extremity Ankle Circles/Pumps: AROM;10 reps Quad Sets: AROM;Strengthening;Right;10 reps;Left Gluteal Sets: AROM;Strengthening;Both;10 reps Heel Slides: AAROM;10 reps;Strengthening;Right;AROM Hip ABduction/ADduction: Strengthening;AAROM;10 reps;AROM;Left    General Comments        Pertinent Vitals/Pain Pain Assessment: Faces Faces Pain Scale: Hurts even more Pain Location: R hip and thigh Pain Descriptors / Indicators: Grimacing;Moaning;Guarding Pain Intervention(s): Limited activity within patient's tolerance;Monitored during session;Premedicated before session;Repositioned    Home Living                      Prior Function  PT Goals (current goals can now be found in the care plan section) Progress towards PT goals: Progressing toward goals    Frequency    BID      PT Plan Current plan remains appropriate    Co-evaluation              AM-PAC PT "6 Clicks" Mobility   Outcome Measure  Help needed turning from your back to your side while in a flat bed without using bedrails?: A Lot Help needed moving from lying on your back to sitting on the side of a flat  bed without using bedrails?: Total Help needed moving to and from a bed to a chair (including a wheelchair)?: Total Help needed standing up from a chair using your arms (e.g., wheelchair or bedside chair)?: Total Help needed to walk in hospital room?: Total Help needed climbing 3-5 steps with a railing? : Total 6 Click Score: 7    End of Session Equipment Utilized During Treatment: Gait belt Activity Tolerance: Patient limited by pain Patient left: with call bell/phone within reach;with SCD's reapplied;in bed;with family/visitor present;with bed alarm set Nurse Communication: Mobility status PT Visit Diagnosis: Muscle weakness (generalized) (M62.81);Difficulty in walking, not elsewhere classified (R26.2)     Time: 4854-6270 PT Time Calculation (min) (ACUTE ONLY): 24 min  Charges:  $Therapeutic Exercise: 23-37 mins                     Olga Coaster PT, DPT 2:13 PM,05/18/20

## 2020-05-18 NOTE — Care Management Important Message (Signed)
Important Message  Patient Details  Name: Rebecca Peck MRN: [id number] Date of Birth: 1946-08-30   Medicare Important Message Given:  Yes     Rebecca Peck A Rebecca Peck 05/18/2020, 11:29 AM

## 2020-05-18 NOTE — Plan of Care (Signed)
  Problem: Education: Goal: Knowledge of General Education information will improve Description: Including pain rating scale, medication(s)/side effects and non-pharmacologic comfort measures Outcome: Progressing   Problem: Clinical Measurements: Goal: Will remain free from infection Outcome: Progressing Goal: Respiratory complications will improve Outcome: Progressing   Problem: Coping: Goal: Level of anxiety will decrease Outcome: Progressing   

## 2020-05-18 NOTE — Progress Notes (Signed)
Physical Therapy Treatment Patient Details Name: Rebecca Peck MRN: [id number] DOB: [birth year] Today's Date: 05/18/2020    History of Present Illness 74 year old female who is household ambulator with a cane who has had trouble with being unsteady with her gait and suffered a fall and subsequent R hip fracture with ORIF IM nailing 6/30.    PT Comments    Pt alert, family at bedside. Son assisted with providing pt motivation to participate with therapy. Improved muscle activation noted for exercises, still AAROM. Supine to sit with modA and heavy use of bed rails. Pt able to sit EOB for several minutes with fair balance, reliant on BUE and BLE support. Pt endorsed feeling "swimmy headed" that decreased slightly with time. Sit <> stand attempted maxA with RW, pt with improved participation, but ulitmately maxAx2 with RW for safety. Pt able to lift LEs to perform stand pivot to chair. The patient would benefit from further skilled PT intervention to continue to progress towards goals. Recommendation remains appropriate.     Follow Up Recommendations  SNF;Supervision/Assistance - 24 hour     Equipment Recommendations  3in1 (PT)    Recommendations for Other Services       Precautions / Restrictions Precautions Precautions: Fall Restrictions Weight Bearing Restrictions: Yes RLE Weight Bearing: Weight bearing as tolerated    Mobility  Bed Mobility Overal bed mobility: Needs Assistance Bed Mobility: Supine to Sit     Supine to sit: Mod assist;HOB elevated     General bed mobility comments: extensive use of bed rails, improved ability to assist with transfer today  Transfers Overall transfer level: Needs assistance Equipment used: Rolling walker (2 wheeled) Transfers: Sit to/from [employer] Sit to Stand: Max assist;+2 physical assistance;From elevated surface Stand pivot transfers: Max assist;+2 physical assistance       General transfer comment: Pt with  improved ability to take several steps in preparation to transfer to recliner.  Ambulation/Gait             General Gait Details: deferred   Stairs             Wheelchair Mobility    Modified Rankin (Stroke Patients Only)       Balance Overall balance assessment: Needs assistance   Sitting balance-Leahy Scale: Poor Sitting balance - Comments: reliant on UE support and MinA throughout sitting except for a few instances of CGA   Standing balance support: Bilateral upper extremity supported Standing balance-Leahy Scale: Zero Standing balance comment: highly reliant on walker and assist from PT to maintain standing                            Cognition Arousal/Alertness: Awake/alert Behavior During Therapy: Anxious Overall Cognitive Status: Within Functional Limits for tasks assessed                                        Exercises General Exercises - Lower Extremity Ankle Circles/Pumps: AROM;10 reps Heel Slides: AAROM;10 reps;Strengthening;Right;AROM Hip ABduction/ADduction: Strengthening;AAROM;10 reps;AROM;Left    General Comments        Pertinent Vitals/Pain Pain Assessment: Faces Faces Pain Scale: Hurts even more Pain Location: R hip and thigh Pain Descriptors / Indicators: Grimacing;Moaning;Guarding Pain Intervention(s): Limited activity within patient's tolerance;Monitored during session;Premedicated before session;Repositioned    Home Living  Prior Function            PT Goals (current goals can now be found in the care plan section) Progress towards PT goals: Progressing toward goals    Frequency    BID      PT Plan Current plan remains appropriate    Co-evaluation              AM-PAC PT "6 Clicks" Mobility   Outcome Measure  Help needed turning from your back to your side while in a flat bed without using bedrails?: A Lot Help needed moving from lying on your back  to sitting on the side of a flat bed without using bedrails?: Total Help needed moving to and from a bed to a chair (including a wheelchair)?: Total Help needed standing up from a chair using your arms (e.g., wheelchair or bedside chair)?: Total Help needed to walk in hospital room?: Total Help needed climbing 3-5 steps with a railing? : Total 6 Click Score: 7    End of Session Equipment Utilized During Treatment: Gait belt   Patient left: with call bell/phone within reach;with family/visitor present;in chair;with chair alarm set;with SCD's reapplied Nurse Communication: Mobility status PT Visit Diagnosis: Muscle weakness (generalized) (M62.81);Difficulty in walking, not elsewhere classified (R26.2)     Time: 1027-1100 PT Time Calculation (min) (ACUTE ONLY): 33 min  Charges:  $Therapeutic Exercise: 23-37 mins                     Olga Coaster PT, DPT 11:12 AM,05/18/20

## 2020-05-18 NOTE — Plan of Care (Signed)
  Problem: Education: Goal: Knowledge of General Education information will improve Description Including pain rating scale, medication(s)/side effects and non-pharmacologic comfort measures Outcome: Progressing   

## 2020-05-18 NOTE — Progress Notes (Signed)
Pt noted with one episode vomiting approx. 7:30pm shortly following ingesting magnesium cit. per order, pt noted to have ingested approx. half dose at time of episode, pt son voiced concerns regarding medication absorption not yet able to take place due to vomiting, Dr. Joice Lofts made aware of concerns, MD instructed not to continue with remaining magnesium citrate and to reassess next step regarding bowel motility in morning. Pt and son made aware of MD instruction and in agreement with plan.

## 2020-05-18 NOTE — Progress Notes (Signed)
Pt's son wants to ask help from Chaplain with the Power of [place]. Chaplain was called.

## 2020-05-18 NOTE — Progress Notes (Signed)
Pt has no BM post surgery, not passing gas with abdominal distention and tenderness. Fleet enema and suppository was done with no result. MD was notified. KUB xray was ordered that shows possible colonic ileus. Dr. Nelson Chimes was notifed and ordered to give another suppository and Mg citrate, keep pt clear liquid. To inform night RN.

## 2020-05-18 NOTE — Progress Notes (Signed)
Chaplain assisted patient and patient's son in completing HCPO. Document. Chaplain will advise AM Chaplain of completed document for follow for notary and so witness maybe located.

## 2020-05-19 ENCOUNTER — Inpatient Hospital Stay: Payer: Medicare HMO

## 2020-05-19 LAB — CBC WITH DIFFERENTIAL/PLATELET
Abs Immature Granulocytes: 0.04 10*3/uL (ref 0.00–0.07)
Basophils Absolute: 0 10*3/uL (ref 0.0–0.1)
Basophils Relative: 0 %
Eosinophils Absolute: 0 10*3/uL (ref 0.0–0.5)
Eosinophils Relative: 0 %
HCT: 22.9 % — ABNORMAL LOW (ref 36.0–46.0)
Hemoglobin: 7.7 g/dL — ABNORMAL LOW (ref 12.0–15.0)
Immature Granulocytes: 0 %
Lymphocytes Relative: 16 %
Lymphs Abs: 1.5 10*3/uL (ref 0.7–4.0)
MCH: 29.7 pg (ref 26.0–34.0)
MCHC: 33.6 g/dL (ref 30.0–36.0)
MCV: 88.4 fL (ref 80.0–100.0)
Monocytes Absolute: 0.7 10*3/uL (ref 0.1–1.0)
Monocytes Relative: 8 %
Neutro Abs: 6.8 10*3/uL (ref 1.7–7.7)
Neutrophils Relative %: 76 %
Platelets: 287 10*3/uL (ref 150–400)
RBC: 2.59 MIL/uL — ABNORMAL LOW (ref 3.87–5.11)
RDW: 14.8 % (ref 11.5–15.5)
WBC: 9 10*3/uL (ref 4.0–10.5)
nRBC: 0 % (ref 0.0–0.2)

## 2020-05-19 LAB — BASIC METABOLIC PANEL
Anion gap: 9 (ref 5–15)
BUN: 7 mg/dL — ABNORMAL LOW (ref 8–23)
CO2: 22 mmol/L (ref 22–32)
Calcium: 7.6 mg/dL — ABNORMAL LOW (ref 8.9–10.3)
Chloride: 95 mmol/L — ABNORMAL LOW (ref 98–111)
Creatinine, Ser: 0.52 mg/dL (ref 0.44–1.00)
GFR calc Af Amer: >60 mL/min (ref >60)
GFR calc non Af Amer: >60 mL/min (ref >60)
Glucose, Bld: 98 mg/dL (ref 70–99)
Potassium: 3.5 mmol/L (ref 3.5–5.1)
Sodium: 126 mmol/L — ABNORMAL LOW (ref 135–145)

## 2020-05-19 LAB — ALBUMIN: Albumin: 2.3 g/dL — ABNORMAL LOW (ref 3.5–5.0)

## 2020-05-19 LAB — MAGNESIUM: Magnesium: 2 mg/dL (ref 1.7–2.4)

## 2020-05-19 MED ORDER — NICOTINE 21 MG/24HR TD PT24
21.0000 mg | MEDICATED_PATCH | Freq: Every day | TRANSDERMAL | Status: DC
  Administered 2020-05-19 – 2020-05-27 (×9): 21 mg via TRANSDERMAL
  Filled 2020-05-19 (×9): qty 1

## 2020-05-19 MED ORDER — METHYLNALTREXONE BROMIDE 12 MG/0.6ML ~~LOC~~ SOLN
12.0000 mg | Freq: Once | SUBCUTANEOUS | Status: AC
  Administered 2020-05-19: 12 mg via SUBCUTANEOUS
  Filled 2020-05-19: qty 0.6

## 2020-05-19 MED ORDER — LORAZEPAM 2 MG/ML IJ SOLN
0.5000 mg | INTRAMUSCULAR | Status: DC | PRN
  Administered 2020-05-19 – 2020-05-24 (×4): 0.5 mg via INTRAVENOUS
  Filled 2020-05-19 (×4): qty 1

## 2020-05-19 MED ORDER — CYCLOBENZAPRINE HCL 10 MG PO TABS
10.0000 mg | ORAL_TABLET | Freq: Three times a day (TID) | ORAL | Status: DC | PRN
  Administered 2020-05-19 – 2020-05-26 (×15): 10 mg via ORAL
  Filled 2020-05-19 (×17): qty 1

## 2020-05-19 MED ORDER — KETOROLAC TROMETHAMINE 15 MG/ML IJ SOLN
15.0000 mg | Freq: Four times a day (QID) | INTRAMUSCULAR | Status: AC | PRN
  Administered 2020-05-19 – 2020-05-22 (×6): 15 mg via INTRAVENOUS
  Filled 2020-05-19 (×5): qty 1

## 2020-05-19 NOTE — Progress Notes (Signed)
Physical Therapy Treatment Patient Details Name: Rebecca Peck MRN: [id number] DOB: 06-17-1946 Today's Date: 05/19/2020    History of Present Illness 74 year old female who is household ambulator with a cane who has had trouble with being unsteady with her gait and suffered a fall and subsequent R hip fracture with ORIF IM nailing 6/30.    PT Comments    Pt sleeping in bed upon PT arrival; pt's family present.  Pt recently received Toradol and Ativan medication per chart review (nurse reports change in medication in regards to addressing pt's pain).  Pt's son verbalizing concern over pt performing therapy without adequate pain medication (d/t pain medication change) but agreeable to having pt attempt to participate in therapy.  Upon waking pt up, pt reporting 10/10 pain R LE; pt agreeable to ex's in bed (but not OOB mobility or sitting on edge of bed).  Pt tolerated minimal LE ex's d/t B LE pain (pt's son reports pt with h/o B LE issues and L LE pain was worse than R LE prior to hospitalization).  Discussed with pt's nurse.  Will attempt to progress pt with functional mobility and ex's per pt tolerance.     Follow Up Recommendations  SNF      Equipment Recommendations  3in1 (PT)    Recommendations for Other Services       Precautions / Restrictions Precautions Precautions: Fall Precaution Comments: Gastric tube; NPO Restrictions Weight Bearing Restrictions: Yes RLE Weight Bearing: Weight bearing as tolerated    Mobility  Bed Mobility                  Transfers                    Ambulation/Gait                 Stairs             Wheelchair Mobility    Modified Rankin (Stroke Patients Only)       Balance                                            Cognition Arousal/Alertness: Awake/alert Behavior During Therapy: Anxious Overall Cognitive Status: Within Functional Limits for tasks assessed                                         Exercises Total Joint Exercises Heel Slides: AAROM;Strengthening;Both;5 reps;Supine Other Exercises Other Exercises: x10 seconds B heel cord stretching (both limited d/t muscle spasms in B LE's)    General Comments   Nursing cleared pt for participation in physical therapy.  Pt agreeable to limited PT session.      Pertinent Vitals/Pain Pain Assessment: 0-10 Pain Score: 10-Worst pain ever (but then falls asleep) Pain Location: R hip and thigh Pain Descriptors / Indicators: Grimacing;Guarding Pain Intervention(s): Limited activity within patient's tolerance;Monitored during session;Premedicated before session;Repositioned    Home Living                      Prior Function            PT Goals (current goals can now be found in the care plan section) Acute Rehab PT Goals Patient Stated Goal: get back home after rehab PT Goal  Formulation: With patient Time For Goal Achievement: 05/28/20 Potential to Achieve Goals: Fair Progress towards PT goals: Progressing toward goals    Frequency    BID      PT Plan Current plan remains appropriate    Co-evaluation              AM-PAC PT "6 Clicks" Mobility   Outcome Measure  Help needed turning from your back to your side while in a flat bed without using bedrails?: A Lot Help needed moving from lying on your back to sitting on the side of a flat bed without using bedrails?: Total Help needed moving to and from a bed to a chair (including a wheelchair)?: Total Help needed standing up from a chair using your arms (e.g., wheelchair or bedside chair)?: Total Help needed to walk in hospital room?: Total Help needed climbing 3-5 steps with a railing? : Total 6 Click Score: 7    End of Session   Activity Tolerance: Patient limited by pain Patient left: in bed;with call bell/phone within reach;with bed alarm set;with family/visitor present;with SCD's reapplied;Other (comment) (B heels  floating via pillow) Nurse Communication: Mobility status;Precautions;Other (comment) (pt's pain status) PT Visit Diagnosis: Muscle weakness (generalized) (M62.81);Difficulty in walking, not elsewhere classified (R26.2)     Time: 1007-1219 PT Time Calculation (min) (ACUTE ONLY): 23 min  Charges:  $Therapeutic Exercise: 23-37 mins                    Hendricks Limes, PT 05/19/20, 1:01 PM

## 2020-05-19 NOTE — Progress Notes (Signed)
NGT was placed per MD order connected to intermittent low suction with minimal output. Pt had a small black BM. MD notified.

## 2020-05-19 NOTE — Progress Notes (Signed)
PT Cancellation Note  Patient Details Name: Rebecca Peck MRN: [id number] DOB: Mar 30, 1946   Cancelled Treatment:    Reason Eval/Treat Not Completed: Other (comment).  Pt soundly sleeping upon PT arrival.  Pt would briefly wake up but then would fall quickly back asleep.  Pt appearing uncomfortable/in pain (in LE's) when briefly awake.  Pt's son requesting to hold therapy this afternoon and let pt rest for the rest of the day and re-attempt therapy tomorrow.  Plan to re-attempt PT treatment session tomorrow.  Hendricks Limes, PT 05/19/20, 3:38 PM

## 2020-05-19 NOTE — Progress Notes (Signed)
  Chaplain On-Call received page from [employer] with report of patient's request to complete HCPOA document. Chaplain met patient, husband and son at bedside and confirmed the request.  Chaplain facilitated completion of the HCPOA document with two Witnesses (Vol[employer]ers Danford Bad and Cleon Gustin) and Ko Olina (who is also the [employer])  Chaplain made a copy of the document for the [employer] [place]. Chaplain also made a copy that the Unit Secretary will place in patient's medical record. Unit Secretary will give the original document to patient and family, after medical team completes a bedside procedure for the patient.  [place] Derry, M.Div., [facility]

## 2020-05-19 NOTE — Progress Notes (Signed)
°  Subjective: 6 Days Post-Op Procedure(s) (LRB): INTRAMEDULLARY (IM) NAIL FEMORAL (Right) Patient is sleeping in bed when I enter the room. Patient's son at bedside.   Objective: Vital signs in last 24 hours: Temp:  [98 F (36.7 C)-98.3 F (36.8 C)] 98.2 F (36.8 C) (07/06 0811) Pulse Rate:  [80-86] 80 (07/06 0811) Resp:  [16-18] 18 (07/06 0811) BP: (101-141)/(71-75) 101/71 (07/06 0811) SpO2:  [94 %-96 %] 96 % (07/06 0811)  Intake/Output from previous day:  Intake/Output Summary (Last 24 hours) at 05/19/2020 0900 Last data filed at 05/19/2020 9242 Gross per 24 hour  Intake 0 ml  Output 701 ml  Net -701 ml    Intake/Output this shift: No intake/output data recorded.  Labs: Recent Labs    05/17/20 0426 05/18/20 0701 05/19/20 0503  HGB 8.2* 8.1* 7.7*   Recent Labs    05/18/20 0701 05/19/20 0503  WBC 9.8 9.0  RBC 2.71* 2.59*  HCT 23.2* 22.9*  PLT 258 287   Recent Labs    05/18/20 0701 05/19/20 0503  NA 124* 126*  K 3.3* 3.5  CL 93* 95*  CO2 22 22  BUN 7* 7*  CREATININE 0.47 0.52  GLUCOSE 92 98  CALCIUM 7.6* 7.6*   No results for input(s): LABPT, INR in the last 72 hours.   EXAM General - Patient is sleeping peacefully Extremity - Compartment soft capillary refill <3 seconds Dressing/Incision -all 3 dressings clean with minimal drainage.    Assessment/Plan: 6 Days Post-Op Procedure(s) (LRB): INTRAMEDULLARY (IM) NAIL FEMORAL (Right) Principal Problem:   Intertrochanteric fracture of right femur, closed, initial encounter (HCC) Active Problems:   Hypothyroidism   Hypertension   Hyperlipidemia   Chronic back pain   CAD (coronary artery disease)   Tobacco use  Estimated body mass index is 19.69 kg/m as calculated from the following:   Height as of this encounter: 5\' 6"  (1.676 m).   Weight as of this encounter: 55.3 kg. Up with therapy    DVT Prophylaxis - Lovenox, Ted hose and SCDs Weight-Bearing as tolerated to right leg  ,  PA-C [facility] 05/19/2020, 9:00 AM

## 2020-05-19 NOTE — Progress Notes (Signed)
PROGRESS NOTE    Rebecca Peck  UXL:[id number] DOB: February 09, 1946 DOA: 05/12/2020 PCP: Patient, No Pcp Per   Brief Narrative:  Rebecca Peck Pickardis a 74 y.o.femalewith medical history significant forCADwith remote stent placement, hypertension, hyperlipidemia, hypothyroidism, GERD,tobacco use,and chronic back pain who was admitted 05/12/2020 with right hip fracture status post mechanical fall s/p ORIF.  Developed anorexia, nausea and vomiting postoperatively, some concern of ileus but CT abdomen was negative.  Subjective: She was complaining of hip pain.  Abdominal distention and nausea with vomiting.  No flatulence or bowel movement.  Assessment & Plan:   Principal Problem:   Intertrochanteric fracture of right femur, closed, initial encounter (HCC) Active Problems:   Hypothyroidism   Hypertension   Hyperlipidemia   Chronic back pain   CAD (coronary artery disease)   Tobacco use  Distended abdomen/constipation/ileus.  Patient has soft distended abdomen.  CT abdomen done yesterday was without any sign of small bowel obstruction or ileus.  Still no BM with good bowel regimen.  Patient developed vomiting with worsening abdominal distention. Repeat KUB with possible ileus.  Most likely secondary to excessive opioid use.  Patient was on high dose oxycodone and using Flexeril 3-4 times a day. -NG tube placement. -Keep her n.p.o. to give rest to gut. -Discontinue scheduled oxycodone which is scheduled for 5 times daily, patient has an history of chronic oxycodone use, per son she used to take 3 to 4 tablets/day, currently taking up to 7 tablets daily. -Make Flexeril as needed instead of scheduled. -Continue as needed oxycodone-we will use as needed Dilaudid as patient is n.p.o. at this time. -Talked with the nursing staff to give Toradol and Ativan to decrease her anxiety and see if that is able to control her pain before giving her opioids. -Give one-time dose of methylnaltrexone  for a possible opioid-induced constipation.  Right hip fracture.  Secondary to mechanical fall.  POD day 6. S/p intramedullary nail placement. PT/OT recommending SNF-patient did had a bed offer but unable to leave due to persistent uncontrolled pain and now ileus.  Chronic hyponatremia.Sodium at her baseline of 126.  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 -We can consider fluid restriction once start taking p.o.  Currently at a maintenance fluid of normal saline at 75 cc an hour.  Hypokalemia.  Potassium at 3.5 today -Replete electrolytes as needed. - magnesium-2.0  Anemia secondary to surgical blood loss.  Hemoglobin at 7.7 today. -Continue iron supplement. -Continue to monitor  Hypertension.  Blood pressure within goal.   -Continue home dose of carvedilol, amlodipine and lisinopril.  Hyperlipidemia: Continue pravastatin  Hypothyroidism: Continue Synthroid.  Chronic back pain: Was on oxycodone for couple of years.  We will try to back off due to development of ileus.  Objective: Vitals:   05/18/20 1608 05/18/20 2322 05/19/20 0811 05/19/20 1602  BP: 138/75 (!) 141/73 101/71 (!) 146/69  Pulse: 85 86 80 78  Resp: Temp: 98.3 F (36.8 C) 98 F (36.7 C) 98.2 F (36.8 C) 97.8 F (36.6 C)  TempSrc: Oral Oral Oral Oral  SpO2: 94% 95% 96% 98%  Weight:      Height:        Intake/Output Summary (Last 24 hours) at 05/19/2020 1749 Last data filed at 05/19/2020 1300 Gross per 24 hour  Intake 0 ml  Output 1401 ml  Net -1401 ml   Filed Weights   05/12/20 1551 05/13/20 1420  Weight: 55.3 kg 55.3 kg    Examination:  General exam: Appears calm and comfortable, NG tube in place Respiratory system: Clear to auscultation. Respiratory effort normal. Cardiovascular system: S1 & S2 heard, RRR. No JVD, murmurs, Gastrointestinal system: Soft, nontender, mildly distended, bowel sounds positive. Central nervous system: Alert and oriented. No focal neurological  deficits. Extremities: No edema, no cyanosis, pulses intact and symmetrical. Psychiatry: Judgement and insight appear impaired.  DVT prophylaxis: Lovenox Code Status: Full Family Communication: Husband and son were updated at bedside Disposition Plan:  Status is: Inpatient  Remains inpatient appropriate because:Inpatient level of care appropriate due to severity of illness   Dispo: The patient is from: Home              Anticipated d/c is to: SNF              Anticipated d/c date is: 2 days.              Patient currently is not medically stable to d/c.  Consultants:   Orthopedic  Procedures:  Antimicrobials:   Data Reviewed: I have personally reviewed following labs and imaging studies  CBC: Recent Labs  Lab 05/14/20 0426 05/15/20 0619 05/17/20 0426 05/18/20 0701 05/19/20 0503  WBC 10.4 7.5 10.2 9.8 9.0  NEUTROABS  --   --   --   --  6.8  HGB 9.4* 8.5* 8.2* 8.1* 7.7*  HCT 27.0* 23.8* 24.2* 23.2* 22.9*  MCV 87.1 85.9 89.3 85.6 88.4  PLT 195 175 244 258 287   Basic Metabolic Panel: Recent Labs  Lab 05/12/20 1957 05/13/20 0427 05/15/20 0619 05/16/20 0422 05/17/20 0426 05/18/20 0701 05/19/20 0503  NA 122*   < > 126* 123* 126* 124* 126*  K 3.3*   < > 3.1* 4.0 4.1 3.3* 3.5  CL 90*   < > 93* 91* 93* 93* 95*  CO2 25   < > 26 25 25 22 22   GLUCOSE 118*   < > 100* 101* 108* 92 98  BUN 10   < > 6* 6* 9 7* 7*  CREATININE 0.72   < > 0.57 0.50 0.68 0.47 0.52  CALCIUM 8.7*   < > 7.7* 7.9* 7.9* 7.6* 7.6*  MG 1.9  --   --  2.1  --   --  2.0   < > = values in this interval not displayed.   GFR: Estimated Creatinine Clearance: 53.9 mL/min (by C-G formula based on SCr of 0.52 mg/dL). Liver Function Tests: Recent Labs  Lab 05/18/20 0701 05/19/20 0503  AST 24  --   ALT 14  --   ALKPHOS 68  --   BILITOT 1.1  --   PROT 6.0*  --   ALBUMIN 2.5* 2.3*   No results for input(s): LIPASE, AMYLASE in the last 168 hours. No results for input(s): AMMONIA in the last 168  hours. Coagulation Profile: Recent Labs  Lab 05/12/20 1957  INR 1.0   Cardiac Enzymes: No results for input(s): CKTOTAL, CKMB, CKMBINDEX, TROPONINI in the last 168 hours. BNP (last 3 results) No results for input(s): PROBNP in the last 8760 hours. HbA1C: No results for input(s): HGBA1C in the last 72 hours. CBG: No results for input(s): GLUCAP in the last 168 hours. Lipid Profile: No results for input(s): CHOL, HDL, LDLCALC, TRIG, CHOLHDL, LDLDIRECT in the last 72 hours. Thyroid Function Tests: No results for input(s): TSH, T4TOTAL, FREET4, T3FREE, THYROIDAB in the last 72 hours. Anemia Panel: Recent Labs    05/18/20 0701  FERRITIN 97  TIBC 221*  IRON 21*   Sepsis Labs: No results for input(s): PROCALCITON, LATICACIDVEN in the last 168 hours.  Recent Results (from the past 240 hour(s))  SARS Coronavirus 2 by RT PCR (hospital order, performed in [facility]) Nasopharyngeal Nasopharyngeal Swab     Status: None   Collection Time: 05/12/20  6:25 PM   Specimen: Nasopharyngeal Swab  Result Value Ref Range Status   SARS Coronavirus 2 NEGATIVE NEGATIVE Final    Comment: (NOTE) SARS-CoV-2 target nucleic acids are NOT DETECTED.  The SARS-CoV-2 RNA is generally detectable in upper and lower respiratory specimens during the acute phase of infection. The lowest concentration of SARS-CoV-2 viral copies this assay can detect is 250 copies / mL. A negative result does not preclude SARS-CoV-2 infection and should not be used as the sole basis for treatment or other patient management decisions.  A negative result may occur with improper specimen collection / handling, submission of specimen other than nasopharyngeal swab, presence of viral mutation(s) within the areas targeted by this assay, and inadequate number of viral copies (<250 copies / mL). A negative result must be combined with clinical observations, patient history, and epidemiological information.  Fact  Sheet for Patients:   BoilerBrush.com.cyhttps://www.fda.gov/media/136312/download  Fact Sheet for Healthcare Providers: https://pope.com/https://www.fda.gov/media/136313/download  This test is not yet approved or  cleared by the [place] FDA and has been authorized for detection and/or diagnosis of SARS-CoV-2 by FDA under an Emergency Use Authorization (EUA).  This EUA will remain in effect (meaning this test can be used) for the duration of the COVID-19 declaration under Section 564(b)(1) of the Act, 21 U.S.C. section 360bbb-3(b)(1), unless the authorization is terminated or revoked sooner.  Performed at [facility], [address] [place], [place] [address]   SARS CORONAVIRUS 2 (TAT 6-24 HRS) Nasopharyngeal Nasopharyngeal Swab     Status: None   Collection Time: 05/18/20  1:03 PM   Specimen: Nasopharyngeal Swab  Result Value Ref Range Status   SARS Coronavirus 2 NEGATIVE NEGATIVE Final    Comment: (NOTE) SARS-CoV-2 target nucleic acids are NOT DETECTED.  The SARS-CoV-2 RNA is generally detectable in upper and lower respiratory specimens during the acute phase of infection. Negative results do not preclude SARS-CoV-2 infection, do not rule out co-infections with other pathogens, and should not be used as the sole basis for treatment or other patient management decisions. Negative results must be combined with clinical observations, patient history, and epidemiological information. The expected result is Negative.  Fact Sheet for Patients: HairSlick.nohttps://www.fda.gov/media/138098/download  Fact Sheet for Healthcare Providers: quierodirigir.comhttps://www.fda.gov/media/138095/download  This test is not yet approved or cleared by the [place] FDA and  has been authorized for detection and/or diagnosis of SARS-CoV-2 by FDA under an Emergency Use Authorization (EUA). This EUA will remain  in effect (meaning this test can be used) for the duration of the COVID-19 declaration under Se ction 564(b)(1) of the Act, 21  U.S.C. section 360bbb-3(b)(1), unless the authorization is terminated or revoked sooner.  Performed at [facility], [address] [place], [place] [address]      Radiology Studies: CT ABDOMEN PELVIS WO CONTRAST  Result Date: 05/17/2020 CLINICAL DATA:  Abdominal distension EXAM: CT ABDOMEN AND PELVIS WITHOUT CONTRAST TECHNIQUE: Multidetector CT imaging of the abdomen and pelvis was performed following the standard protocol without IV contrast. COMPARISON:  None. FINDINGS: Lower chest: Mild atelectasis is seen within the posterior aspect of the bilateral lung bases. Hepatobiliary: No focal liver abnormality is seen. Status post cholecystectomy.  No biliary dilatation. Pancreas: Unremarkable. No pancreatic ductal dilatation or surrounding inflammatory changes. Spleen: Normal in size without focal abnormality. Adrenals/Urinary Tract: Adrenal glands are unremarkable. The right kidney is atrophic in appearance. Mild compensatory hypertrophy of the left kidney is seen. No focal lesions are identified. There is mild right-sided perinephric inflammatory fat stranding. Mild left-sided hydronephrosis and proximal hydroureter are seen. A small amount of air is seen within the lumen of an otherwise moderately distended urinary bladder. Stomach/Bowel: A small hiatal hernia is seen. The appendix is not clearly identified. Oral contrast is seen throughout the small bowel which is normal in caliber. Air is seen throughout the course of the large bowel, without evidence of stool. Vascular/Lymphatic: There is marked severity calcification of the abdominal aorta with approximately 4.4 cm x 4.3 cm aneurysmal dilatation of the infrarenal abdominal aorta. No enlarged abdominal or pelvic lymph nodes. Reproductive: Status post hysterectomy. No adnexal masses. Other: No abdominal wall hernia or abnormality. No abdominopelvic ascites. Musculoskeletal: A mild amount of intramuscular air is seen along the lateral right pelvic  wall. A metallic density intramedullary rod and compression screw device are seen within the proximal right femur. Marked severity degenerative changes seen within the mid and lower lumbar spine. IMPRESSION: 1. 4.4 cm x 4.3 cm infrarenal abdominal aortic aneurysm. 2. Small hiatal hernia. 3. Evidence of prior cholecystectomy and hysterectomy. 4. Minimal stool burden without evidence of bowel obstruction. 5. Prior open reduction and internal fixation of the proximal right femur with postoperative changes suspected within the soft tissues along the right pelvic wall. Aortic Atherosclerosis (ICD10-I70.0). Electronically Signed   By: Aram Candela M.D.   On: 05/17/2020 18:24   DG Abd 1 View  Result Date: 05/19/2020 CLINICAL DATA:  Nasogastric tube placement. EXAM: ABDOMEN - 1 VIEW COMPARISON:  Same day. FINDINGS: Mildly dilated large and small bowel loops are noted suggesting ileus. Distal tip of nasogastric tube is seen in expected position of proximal stomach. IMPRESSION: Distal tip of nasogastric tube seen in expected position of proximal stomach. Electronically Signed   By: Lupita Raider M.D.   On: 05/19/2020 12:59   DG Abd 1 View  Result Date: 05/19/2020 CLINICAL DATA:  NG tube placement. EXAM: ABDOMEN - 1 VIEW COMPARISON:  05/12/2020. FINDINGS: Upper abdomen imaged. NG tube noted with tip over the stomach. Side hole at the gastroesophageal junction. Advancement of approximately 10 cm should be considered. Bowel distention again noted. No free air. IMPRESSION: 1. NG tube noted with tip in the upper most portion stomach. Side hole is at the gastroesophageal junction. Advancement of the NG tube approximately 10 cm suggested. 2.  Bowel distention again noted. Electronically Signed   By: Maisie Fus  Register   On: 05/19/2020 11:26   DG Abd 1 View  Result Date: 05/18/2020 CLINICAL DATA:  Recent right hip fracture.  Abdominal distension. EXAM: ABDOMEN - 1 VIEW COMPARISON:  05/17/2020 FINDINGS: Persistent  air-filled mildly prominent colonic loops likely colonic ileus. No dilated small bowel loops. No evidence of free peritoneal air. Remainder the exam is unchanged. IMPRESSION: Stable findings suggesting colonic ileus. Electronically Signed   By: Elberta Fortis M.D.   On: 05/18/2020 17:26    Scheduled Meds: . amLODipine  10 mg Oral Daily  . carvedilol  3.125 mg Oral BID  . Chlorhexidine Gluconate Cloth  6 each Topical Daily  . cyclobenzaprine  10 mg Oral TID  . docusate sodium  100 mg Oral BID  . enoxaparin (LOVENOX) injection  40 mg Subcutaneous Q24H  .  ferrous fumarate-b12-vitamic C-folic acid  1 capsule Oral BID  . levothyroxine  125 mcg Oral Daily  . lisinopril  20 mg Oral Daily  . nicotine  21 mg Transdermal Daily  . pantoprazole  40 mg Oral Daily  . polyethylene glycol  17 g Oral Daily  . pravastatin  20 mg Oral Daily  . senna-docusate  1 tablet Oral BID   Continuous Infusions: . sodium chloride 75 mL/hr at 05/18/20 1804  . methocarbamol (ROBAXIN) IV 500 mg (05/18/20 0132)     LOS: 7 days   Time spent: 40 minutes.  Arnetha Courser, MD [facility]  If 7PM-7AM, please contact night-coverage [email or web address]  05/19/2020, 5:49 PM   This record has been created using Conservation officer, historic buildings. Errors have been sought and corrected,but may not always be located. Such creation errors do not reflect on the standard of care.

## 2020-05-20 ENCOUNTER — Inpatient Hospital Stay: Payer: Medicare HMO

## 2020-05-20 LAB — CBC
HCT: 23 % — ABNORMAL LOW (ref 36.0–46.0)
Hemoglobin: 7.8 g/dL — ABNORMAL LOW (ref 12.0–15.0)
MCH: 30.1 pg (ref 26.0–34.0)
MCHC: 33.9 g/dL (ref 30.0–36.0)
MCV: 88.8 fL (ref 80.0–100.0)
Platelets: 313 10*3/uL (ref 150–400)
RBC: 2.59 MIL/uL — ABNORMAL LOW (ref 3.87–5.11)
RDW: 14.8 % (ref 11.5–15.5)
WBC: 8.9 10*3/uL (ref 4.0–10.5)
nRBC: 0 % (ref 0.0–0.2)

## 2020-05-20 LAB — BASIC METABOLIC PANEL
Anion gap: 9 (ref 5–15)
BUN: 7 mg/dL — ABNORMAL LOW (ref 8–23)
CO2: 23 mmol/L (ref 22–32)
Calcium: 7.6 mg/dL — ABNORMAL LOW (ref 8.9–10.3)
Chloride: 97 mmol/L — ABNORMAL LOW (ref 98–111)
Creatinine, Ser: 0.46 mg/dL (ref 0.44–1.00)
GFR calc Af Amer: >60 mL/min (ref >60)
GFR calc non Af Amer: >60 mL/min (ref >60)
Glucose, Bld: 86 mg/dL (ref 70–99)
Potassium: 3.2 mmol/L — ABNORMAL LOW (ref 3.5–5.1)
Sodium: 129 mmol/L — ABNORMAL LOW (ref 135–145)

## 2020-05-20 LAB — MAGNESIUM: Magnesium: 2.1 mg/dL (ref 1.7–2.4)

## 2020-05-20 MED ORDER — POTASSIUM CHLORIDE 10 MEQ/100ML IV SOLN
10.0000 meq | INTRAVENOUS | Status: AC
  Administered 2020-05-20 (×4): 10 meq via INTRAVENOUS
  Filled 2020-05-20 (×4): qty 100

## 2020-05-20 NOTE — Progress Notes (Signed)
Writer present at bedside when PA for general surgery came to assess patient. He gave orders for clamping trials of NG tube in hopes to have NG tube removed if scant output once NG tube unclamped in 4 hrs. NG tube clamped by PA at 1400 and instricted writer to unclamp at 18.00 Patient's husband was also present at bedside for assessment and was able to ask questions and answered by PA.

## 2020-05-20 NOTE — Progress Notes (Signed)
PROGRESS NOTE    Rebecca Peck  LHT:[id number] DOB: [birth year] DOA: 05/12/2020 PCP: Patient, No Pcp Per    Brief Narrative:  Rebecca Ditton Pickardis a 74 y.o.femalewith medical history significant forCADwith remote stent placement, hypertension, hyperlipidemia, hypothyroidism, GERD,tobacco use,and chronic back pain who was admitted 05/12/2020 with right hip fracture status post mechanical fall s/p ORIF.  Developed anorexia, nausea and vomiting postoperatively, some concern of ileus but CT abdomen was negative.  NG tube was placed     Consultants:   General surgery  Procedures: NG tube placement  Antimicrobials:       Subjective: Tube in place.  Still very nauseous.  No abdominal pain.  Feels her abdomen is mildly less distended.  Objective: Vitals:   05/19/20 1602 05/19/20 2312 05/20/20 0852 05/20/20 1453  BP: (!) 146/69 (!) 145/72 (!) 144/68 137/73  Pulse: 78 82 78 81  Resp: 16 16 16 17   Temp: 97.8 F (36.6 C) 98.7 F (37.1 C) 98.4 F (36.9 C) 97.9 F (36.6 C)  TempSrc: Oral Oral Oral Oral  SpO2: 98% 96% 99% 98%  Weight:      Height:        Intake/Output Summary (Last 24 hours) at 05/20/2020 1616 Last data filed at 05/20/2020 1500 Gross per 24 hour  Intake 4467.84 ml  Output 1053 ml  Net 3414.84 ml   Filed Weights   05/12/20 1551 05/13/20 1420  Weight: 55.3 kg 55.3 kg    Examination:  General exam: Appears calm and comfortable, NG tube in place Respiratory system: Clear to auscultation. Respiratory effort normal. Cardiovascular system: S1 & S2 heard, RRR. No JVD, murmurs, rubs, gallops or clicks. No pedal edema. Gastrointestinal system: Abdomen is distended, soft and nontender. No organomegaly or masses felt.  Hypoactive bowel sounds Central nervous system: Alert and oriented.  Grossly intact Extremities: No edema Skin: Warm dry Psychiatry: Judgement and insight appear normal. Mood & affect appropriate.     Data Reviewed: I have personally  reviewed following labs and imaging studies  CBC: Recent Labs  Lab 05/15/20 0619 05/17/20 0426 05/18/20 0701 05/19/20 0503 05/20/20 0540  WBC 7.5 10.2 9.8 9.0 8.9  NEUTROABS  --   --   --  6.8  --   HGB 8.5* 8.2* 8.1* 7.7* 7.8*  HCT 23.8* 24.2* 23.2* 22.9* 23.0*  MCV 85.9 89.3 85.6 88.4 88.8  PLT 175 244 258 287 313   Basic Metabolic Panel: Recent Labs  Lab 05/16/20 0422 05/17/20 0426 05/18/20 0701 05/19/20 0503 05/20/20 0540  NA 123* 126* 124* 126* 129*  K 4.0 4.1 3.3* 3.5 3.2*  CL 91* 93* 93* 95* 97*  CO2 25 25 22 22 23   GLUCOSE 101* 108* 92 98 86  BUN 6* 9 7* 7* 7*  CREATININE 0.50 0.68 0.47 0.52 0.46  CALCIUM 7.9* 7.9* 7.6* 7.6* 7.6*  MG 2.1  --   --  2.0 2.1   GFR: Estimated Creatinine Clearance: 53.9 mL/min (by C-G formula based on SCr of 0.46 mg/dL). Liver Function Tests: Recent Labs  Lab 05/18/20 0701 05/19/20 0503  AST 24  --   ALT 14  --   ALKPHOS 68  --   BILITOT 1.1  --   PROT 6.0*  --   ALBUMIN 2.5* 2.3*   No results for input(s): LIPASE, AMYLASE in the last 168 hours. No results for input(s): AMMONIA in the last 168 hours. Coagulation Profile: No results for input(s): INR, PROTIME in the last 168 hours. Cardiac Enzymes: No results  for input(s): CKTOTAL, CKMB, CKMBINDEX, TROPONINI in the last 168 hours. BNP (last 3 results) No results for input(s): PROBNP in the last 8760 hours. HbA1C: No results for input(s): HGBA1C in the last 72 hours. CBG: No results for input(s): GLUCAP in the last 168 hours. Lipid Profile: No results for input(s): CHOL, HDL, LDLCALC, TRIG, CHOLHDL, LDLDIRECT in the last 72 hours. Thyroid Function Tests: No results for input(s): TSH, T4TOTAL, FREET4, T3FREE, THYROIDAB in the last 72 hours. Anemia Panel: Recent Labs    05/18/20 0701  FERRITIN 97  TIBC 221*  IRON 21*   Sepsis Labs: No results for input(s): PROCALCITON, LATICACIDVEN in the last 168 hours.  Recent Results (from the past 240 hour(s))  SARS  Coronavirus 2 by RT PCR (hospital order, performed in [facility] hospital lab) Nasopharyngeal Nasopharyngeal Swab     Status: None   Collection Time: 05/12/20  6:25 PM   Specimen: Nasopharyngeal Swab  Result Value Ref Range Status   SARS Coronavirus 2 NEGATIVE NEGATIVE Final    Comment: (NOTE) SARS-CoV-2 target nucleic acids are NOT DETECTED.  The SARS-CoV-2 RNA is generally detectable in upper and lower respiratory specimens during the acute phase of infection. The lowest concentration of SARS-CoV-2 viral copies this assay can detect is 250 copies / mL. A negative result does not preclude SARS-CoV-2 infection and should not be used as the sole basis for treatment or other patient management decisions.  A negative result may occur with improper specimen collection / handling, submission of specimen other than nasopharyngeal swab, presence of viral mutation(s) within the areas targeted by this assay, and inadequate number of viral copies (<250 copies / mL). A negative result must be combined with clinical observations, patient history, and epidemiological information.  Fact Sheet for Patients:   BoilerBrush.com.cy  Fact Sheet for Healthcare Providers: https://pope.com/  This test is not yet approved or  cleared by the [place] FDA and has been authorized for detection and/or diagnosis of SARS-CoV-2 by FDA under an Emergency Use Authorization (EUA).  This EUA will remain in effect (meaning this test can be used) for the duration of the COVID-19 declaration under Section 564(b)(1) of the Act, 21 U.S.C. section 360bbb-3(b)(1), unless the authorization is terminated or revoked sooner.  Performed at [facility], [address] [place], [place] [address]   SARS CORONAVIRUS 2 (TAT 6-24 HRS) Nasopharyngeal Nasopharyngeal Swab     Status: None   Collection Time: 05/18/20  1:03 PM   Specimen: Nasopharyngeal Swab  Result  Value Ref Range Status   SARS Coronavirus 2 NEGATIVE NEGATIVE Final    Comment: (NOTE) SARS-CoV-2 target nucleic acids are NOT DETECTED.  The SARS-CoV-2 RNA is generally detectable in upper and lower respiratory specimens during the acute phase of infection. Negative results do not preclude SARS-CoV-2 infection, do not rule out co-infections with other pathogens, and should not be used as the sole basis for treatment or other patient management decisions. Negative results must be combined with clinical observations, patient history, and epidemiological information. The expected result is Negative.  Fact Sheet for Patients: HairSlick.no  Fact Sheet for Healthcare Providers: quierodirigir.com  This test is not yet approved or cleared by the [place] FDA and  has been authorized for detection and/or diagnosis of SARS-CoV-2 by FDA under an Emergency Use Authorization (EUA). This EUA will remain  in effect (meaning this test can be used) for the duration of the COVID-19 declaration under Se ction 564(b)(1) of the Act, 21 U.S.C. section 360bbb-3(b)(1),  unless the authorization is terminated or revoked sooner.  Performed at [facility], [address] [place], [place] [address]          Radiology Studies: DG Abd 1 View  Result Date: 05/20/2020 CLINICAL DATA:  Check nasogastric catheter placement EXAM: ABDOMEN - 1 VIEW COMPARISON:  05/19/2020 FINDINGS: Gastric catheter is again identified within the stomach. Scattered large and small bowel gas is seen and stable. IMPRESSION: Gastric catheter in the stomach. Electronically Signed   By: Alcide CleverMark  Lukens M.D.   On: 05/20/2020 03:21   DG Abd 1 View  Result Date: 05/19/2020 CLINICAL DATA:  Nasogastric tube placement. EXAM: ABDOMEN - 1 VIEW COMPARISON:  Same day. FINDINGS: Mildly dilated large and small bowel loops are noted suggesting ileus. Distal tip of nasogastric tube is  seen in expected position of proximal stomach. IMPRESSION: Distal tip of nasogastric tube seen in expected position of proximal stomach. Electronically Signed   By: Lupita RaiderJames  Green Jr M.D.   On: 05/19/2020 12:59   DG Abd 1 View  Result Date: 05/19/2020 CLINICAL DATA:  NG tube placement. EXAM: ABDOMEN - 1 VIEW COMPARISON:  05/12/2020. FINDINGS: Upper abdomen imaged. NG tube noted with tip over the stomach. Side hole at the gastroesophageal junction. Advancement of approximately 10 cm should be considered. Bowel distention again noted. No free air. IMPRESSION: 1. NG tube noted with tip in the upper most portion stomach. Side hole is at the gastroesophageal junction. Advancement of the NG tube approximately 10 cm suggested. 2.  Bowel distention again noted. Electronically Signed   By: Maisie Fushomas  Register   On: 05/19/2020 11:26   DG Abd 1 View  Result Date: 05/18/2020 CLINICAL DATA:  Recent right hip fracture.  Abdominal distension. EXAM: ABDOMEN - 1 VIEW COMPARISON:  05/17/2020 FINDINGS: Persistent air-filled mildly prominent colonic loops likely colonic ileus. No dilated small bowel loops. No evidence of free peritoneal air. Remainder the exam is unchanged. IMPRESSION: Stable findings suggesting colonic ileus. Electronically Signed   By: Elberta Fortisaniel  Boyle M.D.   On: 05/18/2020 17:26        Scheduled Meds: . amLODipine  10 mg Oral Daily  . carvedilol  3.125 mg Oral BID  . Chlorhexidine Gluconate Cloth  6 each Topical Daily  . enoxaparin (LOVENOX) injection  40 mg Subcutaneous Q24H  . ferrous fumarate-b12-vitamic C-folic acid  1 capsule Oral BID  . levothyroxine  125 mcg Oral Daily  . lisinopril  20 mg Oral Daily  . nicotine  21 mg Transdermal Daily  . pantoprazole  40 mg Oral Daily  . polyethylene glycol  17 g Oral Daily  . pravastatin  20 mg Oral Daily   Continuous Infusions: . sodium chloride 75 mL/hr at 05/18/20 1804    Assessment & Plan:   Principal Problem:   Intertrochanteric fracture of  right femur, closed, initial encounter [facility](HCC) Active Problems:   Hypothyroidism   Hypertension   Hyperlipidemia   Chronic back pain   CAD (coronary artery disease)   Tobacco use   ileus.  Patient has soft distended abdomen.  CT abdomen done  was without any sign of small bowel obstruction or ileus.  Still no BM with good bowel regimen.  Patient developed vomiting with worsening abdominal distention. Repeat KUB with ileus.  Most likely secondary to excessive opioid use.  Patient was on high dose oxycodone and using Flexeril 3-4 times a day. -NG tube placement. -Keep her n.p.o. to give rest to gut. -Discontinue scheduled oxycodone which is scheduled  for 5 times daily, patient has an history of chronic oxycodone use, per son she used to take 3 to 4 tablets/day, currently taking up to 7 tablets daily. -Make Flexeril as needed instead of scheduled. -Continue as needed oxycodone-we will use as needed Dilaudid as patient is n.p.o. at this time. Was given one-time dose of methylnaltrexone for a possible opioid-induced constipation. Consulted Gsx this am for further mx-clamping trial of NGT prior to removal. Clamp NGT for 4 hours, if residuals are less than 150 ccs and she remains without nausea/emesis/abdominal pain then we can remove this - Remain NPO; if she fails clamping trial then we may need to consider parenteral nutrition in 48 hours  - Continue IVF resuscitation -continue to minimize pain meds - Serial KUBs as needed pending clinical findings     - Continue mobilization with therapies as much as possible   Right hip fracture.  Secondary to mechanical fall.  POD day 7. S/p intramedullary nail placement. PT/OT recommending SNF-patient did had a bed offer but unable to leave due to persistent uncontrolled pain and now ileus.  Chronic hyponatremia.Sodium at her baseline of 129.  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 -We can consider fluid restriction once start taking p.o.   Currently at a maintenance fluid of normal saline at 75 cc an hour. Continue to monitor  Hypokalemia.  Potassium at 3.2 -Replete electrolytes as needed. - magnesium-2.1  Anemia secondary to surgical blood loss.  Hemoglobin at 7.8  -Continue iron supplement. -Continue to monitor  Hypertension.  Blood pressure within goal.   -Continue home dose of carvedilol, amlodipine and lisinopril.  Hyperlipidemia: Continue pravastatin  Hypothyroidism: Continue Synthroid.  Chronic back pain: Was on oxycodone for couple of years.  We will try to back off due to development of ileus.   DVT prophylaxis: Lovenox Code Status: Full Family Communication: Son at bedside updated Disposition Plan: Pending SNF when medically stable Status is: Inpatient  Remains inpatient appropriate because:IV treatments appropriate due to intensity of illness or inability to take PO   Dispo:  Patient From: Home  Planned Disposition: Inpatient Rehab  Expected discharge date: 05/22/20  Medically stable for discharge: No             LOS: 8 days   Time spent: 45 min with >50% on coc    Lynn Ito, MD [facility] Pager [phone]  If 7PM-7AM, please contact night-coverage [email or web address] Password [email or web address] 05/20/2020, 4:16 PM

## 2020-05-20 NOTE — Consult Note (Signed)
[facility] SURGICAL CONSULTATION NOTE (initial) - cpt: 57846   HISTORY OF PRESENT ILLNESS (HPI):  74 y.o. female initially presented to [facility] ED 06/29 for evaluation following a mechanical fall at home. At that time she was found to have a right femoral intertrochanteric fracture with varus angulation. She underwent open reduction internal fixation ad IM nail with Dr Rosita Kea on 06/30. She has seemed to develop nausea around post-operative day 3 and this has been persistent since that time. She did develop a few episodes of emesis as well as abdominal distension and lack of bowel function. She did have a NGT placed on 07/06 which was removed and replaced last night. This morning, she reports that she is having no abdominal pain and her distension is somewhat improved. She has had resolution of her nausea and emesis. Since shift change this morning her RN reports she has only had about 100 ccs out of NGT since this morning. She has since passed multiple episodes of flatus and had two bowel movements. She is extremely agitated about the NGT and anxious to get this removed. She continues to have RLE pain as well. She is working with PT/OT and recommendations are SNF.   Surgery is consulted by hospitalist physician Dr. Lynn Ito, MD in this context for evaluation and management of post-operative ileus.  PAST MEDICAL HISTORY (PMH):  Past Medical History:  Diagnosis Date  . CAD (coronary artery disease)   . Chronic back pain   . Hyperlipidemia   . Hypertension   . Hypothyroidism   . Tobacco use      PAST SURGICAL HISTORY (PSH):  Past Surgical History:  Procedure Laterality Date  . FEMUR IM NAIL Right 05/13/2020   Procedure: INTRAMEDULLARY (IM) NAIL FEMORAL;  Surgeon: Kennedy Bucker, MD;  Location: [facility] ORS;  Service: Orthopedics;  Laterality: Right;  . LAPAROSCOPIC CHOLECYSTECTOMY  03/02/2016     MEDICATIONS:  Prior to Admission medications   Medication Sig Start Date End Date  Taking? Authorizing Provider  amLODipine (NORVASC) 5 MG tablet Take 10 mg by mouth daily. 01/17/20  Yes [provider]  carvedilol (COREG) 3.125 MG tablet Take 3.125 mg by mouth 2 (two) times daily. 03/27/20  Yes [provider]  cyclobenzaprine (FLEXERIL) 5 MG tablet Take 5 mg by mouth 3 (three) times daily as needed for muscle spasms. 05/11/20  Yes [provider]  fluticasone (FLONASE) 50 MCG/ACT nasal spray Place 1 spray into both nostrils daily as needed for allergies. 03/29/20  Yes [provider]  levothyroxine (SYNTHROID) 125 MCG tablet Take 125 mcg by mouth daily. 03/27/20  Yes [provider]  lisinopril (ZESTRIL) 20 MG tablet Take 20 mg by mouth daily. 04/04/20  Yes [provider]  pantoprazole (PROTONIX) 40 MG tablet Take 40 mg by mouth daily. 12/30/19  Yes [provider]  pravastatin (PRAVACHOL) 20 MG tablet Take 20 mg by mouth daily. 03/29/20  Yes [provider]  promethazine (PHENERGAN) 25 MG tablet Take 25 mg by mouth every 8 (eight) hours as needed for nausea or vomiting. 05/06/20  Yes [provider]  enoxaparin (LOVENOX) 40 MG/0.4ML injection Inject 0.4 mLs (40 mg total) into the skin daily for 14 days. 05/15/20 05/29/20  Evon Slack, PA-C  oxyCODONE (OXY IR/ROXICODONE) 5 MG immediate release tablet Take 1-2 tablets (5-10 mg total) by mouth every 4 (four) hours as needed (For moderate-severe pain. Hold & Call MD if SBP<90, HR<65, RR<10, O2<90, or altered mental status.). 05/14/20   Floyce Stakes,  Jesse Sans, PA-C  Oxycodone HCl 10 MG TABS Take 1 tablet (10 mg total) by mouth every 6 (six) hours as needed. 05/14/20   Evon Slack, PA-C     ALLERGIES:  Allergies  Allergen Reactions  . Sulfa Antibiotics Rash     SOCIAL HISTORY:  Social History   Socioeconomic History  . Marital status: Married    Spouse name: Not on file  . Number of children: Not on file  . Years of education: Not on file  . Highest  education level: Not on file  Occupational History  . Not on file  Tobacco Use  . Smoking status: Current Every Day Smoker    Packs/day: 0.50  . Smokeless tobacco: Never Used  Substance and Sexual Activity  . Alcohol use: Not Currently  . Drug use: Never  . Sexual activity: Not on file  Other Topics Concern  . Not on file  Social History Narrative  . Not on file   Social Determinants of Health   Financial Resource Strain:   . Difficulty of Paying Living Expenses:   Food Insecurity:   . Worried About Programme researcher, broadcasting/film/video in the Last Year:   . Barista in the Last Year:   Transportation Needs:   . Freight forwarder (Medical):   Marland Kitchen Lack of Transportation (Non-Medical):   Physical Activity:   . Days of Exercise per Week:   . Minutes of Exercise per Session:   Stress:   . Feeling of Stress :   Social Connections:   . Frequency of Communication with Friends and Family:   . Frequency of Social Gatherings with Friends and Family:   . Attends Religious Services:   . Active Member of Clubs or Organizations:   . Attends Banker Meetings:   Marland Kitchen Marital Status:   Intimate Partner Violence:   . Fear of Current or Ex-Partner:   . Emotionally Abused:   Marland Kitchen Physically Abused:   . Sexually Abused:      FAMILY HISTORY:  History reviewed. No pertinent family history.    REVIEW OF SYSTEMS:  Review of Systems  Constitutional: Negative for chills and fever.  HENT: Positive for sore throat (Secondary to NGT). Negative for congestion.   Respiratory: Negative for cough and shortness of breath.   Cardiovascular: Negative for chest pain and palpitations.  Gastrointestinal: Positive for nausea (Resolved) and vomiting (Resolved). Negative for abdominal pain and diarrhea.  Genitourinary: Negative for dysuria and urgency.  Musculoskeletal: Positive for joint pain (Right Hip).  All other systems reviewed and are negative.   VITAL SIGNS:  Temp:  [97.8 F (36.6 C)-98.7  F (37.1 C)] 98.4 F (36.9 C) (07/07 0852) Pulse Rate:  [78-82] 78 (07/07 0852) Resp:  [16] 16 (07/07 0852) BP: (144-146)/(68-72) 144/68 (07/07 0852) SpO2:  [96 %-99 %] 99 % (07/07 0852)     Height: 5\' 6"  (167.6 cm) Weight: 55.3 kg BMI (Calculated): 19.7   INTAKE/OUTPUT:  07/06 0701 - 07/07 0700 In: 0  Out: 1751 [Urine:1750; Stool:1]  PHYSICAL EXAM:  Physical Exam Vitals and nursing note reviewed. Exam conducted with a chaperone present.  Constitutional:      General: She is not in acute distress.    Appearance: Normal appearance. She is normal weight. She is not ill-appearing.  HENT:     Mouth/Throat:     Mouth: Mucous membranes are dry.     Pharynx: Oropharynx is clear.     Comments: NGT in place Eyes:  General: No scleral icterus.    Conjunctiva/sclera: Conjunctivae normal.  Cardiovascular:     Rate and Rhythm: Normal rate and regular rhythm.     Pulses: Normal pulses.     Heart sounds: No murmur heard.   Pulmonary:     Effort: Pulmonary effort is normal. No respiratory distress.     Breath sounds: Normal breath sounds.  Abdominal:     General: Abdomen is flat. There is no distension.     Tenderness: There is no abdominal tenderness. There is no guarding or rebound.     Comments: Previous surgical scars seen, abdomen is soft, mild distension, she is tympanic, no rebound/guarding  Genitourinary:    Comments: Deferred Skin:    General: Skin is warm and dry.     Coloration: Skin is not pale.     Findings: No erythema.  Neurological:     General: No focal deficit present.     Mental Status: She is alert and oriented to person, place, and time.  Psychiatric:        Mood and Affect: Mood normal.        Behavior: Behavior normal.     Comments: Agitated regarding NGT      Labs:  CBC Latest Ref Rng & Units 05/20/2020 05/19/2020 05/18/2020  WBC 4.0 - 10.5 K/uL 8.9 9.0 9.8  Hemoglobin 12.0 - 15.0 g/dL 7.8(L) 7.7(L) 8.1(L)  Hematocrit 36 - 46 % 23.0(L) 22.9(L) 23.2(L)   Platelets 150 - 400 K/uL 313 287 258   CMP Latest Ref Rng & Units 05/20/2020 05/19/2020 05/18/2020  Glucose 70 - 99 mg/dL 86 98 92  BUN 8 - 23 mg/dL 7(L) 7(L) 7(L)  Creatinine 0.44 - 1.00 mg/dL 6.71 2.45 8.09  Sodium 135 - 145 mmol/L 129(L) 126(L) 124(L)  Potassium 3.5 - 5.1 mmol/L 3.2(L) 3.5 3.3(L)  Chloride 98 - 111 mmol/L 97(L) 95(L) 93(L)  CO2 22 - 32 mmol/L 23 22 22   Calcium 8.9 - 10.3 mg/dL 7.6(L) 7.6(L) 7.6(L)  Total Protein 6.5 - 8.1 g/dL - - 6.0(L)  Total Bilirubin 0.3 - 1.2 mg/dL - - 1.1  Alkaline Phos 38 - 126 U/L - - 68  AST 15 - 41 U/L - - 24  ALT 0 - 44 U/L - - 14     Imaging studies:   KUB (05/20/2020) personally reviewed today and compared against previous KUBs and appears unchanged bowel dilation with some gas in colon consistent with ileus, and radiologist interpretation reviewed:  IMPRESSION: Gastric catheter in the stomach.   Assessment/Plan: (ICD-10's: K62.7) 74 y.o. female with clinical improvement in likely post-surgical ileus attributable to major orthopedic surgery, decrease in mobilization, and need for narcotics post-operatively, complicated by pertinent comorbidities including CAD, HTN, HLD.   - Given return of flatus and BM with decrease in NGT output today, I will preform a clamping trial of NGT prior to removal. Clamp NGT for 4 hours, if residuals are less than 150 ccs and she remains without nausea/emesis/abdominal pain then we can remove this  - Remain NPO; if she fails clamping trial then we may need to consider parenteral nutrition in 48 hours  - Continue IVF resuscitation  - Pain control prn (minimize narcotics if feasible); antiemetics prn  - Monitor abdominal examination; on-going bowel function  - Serial KUBs as needed pending clinical findings     - Continue mobilization with therapies as much as possible   - Further management per priam service; we will follow   All of the above findings  and recommendations were discussed with the patient and  her husband as bedside, and all of their questions were answered to their expressed satisfaction.  Thank you for the opportunity to participate in this patient's care.   -- Lynden OxfordZachary Frederick Klinger, PA-C [facility] 05/20/2020, 1:32 PM [phone] M-F: 7am - 4pm

## 2020-05-20 NOTE — Progress Notes (Signed)
Physical Therapy Treatment Patient Details Name: Rebecca Peck MRN: [id number] DOB: 05-Aug-1946 Today's Date: 05/20/2020    History of Present Illness 74 year old female who is household ambulator with a cane who has had trouble with being unsteady with her gait and suffered a fall and subsequent R hip fracture with ORIF IM nailing 6/30.    PT Comments    Pt resting in bed upon PT arrival but awake and conversational.  Pt reporting 10/10 R hip/thigh pain beginning, during, and end of therapy session but pt agreeable to therapy participation (pt received medication this morning to attempt to address pt's pain)--nurse notified.  Pt noted to be incontinent in bed requiring assist for clean-up prior to functional mobility.  Improved ability to tolerated B LE ex's in bed today (with assist for ex's).  2 assist for bed mobility and pt able to stand 2x's with significant 2 assist.  Pt demonstrating fatigue, weakness, and B LE pain with activity.  Will continue to focus on strengthening and progressive functional mobility per pt tolerance.    Follow Up Recommendations  SNF     Equipment Recommendations  3in1 (PT)    Recommendations for Other Services       Precautions / Restrictions Precautions Precautions: Fall Precaution Comments: Gastric tube; NPO Restrictions Weight Bearing Restrictions: Yes RLE Weight Bearing: Weight bearing as tolerated    Mobility  Bed Mobility Overal bed mobility: Needs Assistance Bed Mobility: Supine to Sit;Sit to Supine;Rolling Rolling: Min assist;Mod assist (pillow between pt's knees for support)   Supine to sit: Mod assist;+2 for physical assistance Sit to supine: Mod assist;+2 for physical assistance   General bed mobility comments: assist for trunk and B LE's  Transfers Overall transfer level: Needs assistance Equipment used: Rolling walker (2 wheeled) Transfers: Sit to/from Stand Sit to Stand: Mod assist;Max assist;+2 physical assistance          General transfer comment: x2 trials standing; max assist x2 1st trial and mod assist x2 2nd trial standing (from elevated hospital bed); vc's and tactile cues for UE/LE placement and overall technique  Ambulation/Gait             General Gait Details: Deferred d/t limited time pt able to maintain standing   Stairs             Wheelchair Mobility    Modified Rankin (Stroke Patients Only)       Balance Overall balance assessment: Needs assistance Sitting-balance support: Bilateral upper extremity supported;Feet supported Sitting balance-Leahy Scale: Fair Sitting balance - Comments: steady static sitting but with fatigue pt requiring min to mod assist to support back in sitting   Standing balance support: Bilateral upper extremity supported Standing balance-Leahy Scale: Poor Standing balance comment: heavy UE support on RW and assist from therapy to maintain standing                            Cognition Arousal/Alertness: Awake/alert Behavior During Therapy: Anxious Overall Cognitive Status: Within Functional Limits for tasks assessed                                        Exercises Total Joint Exercises Ankle Circles/Pumps: AROM;Strengthening;Both;10 reps;Supine Quad Sets: AROM;Strengthening;Both;10 reps;Supine Heel Slides: AAROM;Strengthening;Both;10 reps;Supine Hip ABduction/ADduction: AAROM;Strengthening;Both;10 reps;Supine    General Comments   Nursing cleared pt for participation in physical therapy.  Pt agreeable  to PT session.  Pt's son present towards end of therapy session.      Pertinent Vitals/Pain Pain Assessment: 0-10 Pain Score: 10-Worst pain ever Pain Location: R hip and thigh Pain Descriptors / Indicators: Tender;Sore Pain Intervention(s): Limited activity within patient's tolerance;Monitored during session;Premedicated before session;Repositioned;Other (comment) (RN notified)  Vitals (HR and O2 on room air)  stable and WFL throughout treatment session.    Home Living                      Prior Function            PT Goals (current goals can now be found in the care plan section) Acute Rehab PT Goals Patient Stated Goal: get back home after rehab PT Goal Formulation: With patient Time For Goal Achievement: 05/28/20 Potential to Achieve Goals: Fair Progress towards PT goals: Progressing toward goals    Frequency    BID      PT Plan Current plan remains appropriate    Co-evaluation              AM-PAC PT "6 Clicks" Mobility   Outcome Measure  Help needed turning from your back to your side while in a flat bed without using bedrails?: A Lot Help needed moving from lying on your back to sitting on the side of a flat bed without using bedrails?: A Lot Help needed moving to and from a bed to a chair (including a wheelchair)?: Total Help needed standing up from a chair using your arms (e.g., wheelchair or bedside chair)?: Total Help needed to walk in hospital room?: Total Help needed climbing 3-5 steps with a railing? : Total 6 Click Score: 8    End of Session Equipment Utilized During Treatment: Gait belt Activity Tolerance: Patient limited by pain Patient left: in bed;with call bell/phone within reach;with bed alarm set;with family/visitor present;with SCD's reapplied;Other (comment) (B heels floating via pillow support) Nurse Communication: Mobility status;Precautions;Weight bearing status;Other (comment) (pt's pain status; therapist called NT 2x's to notify of need for purewick being replaced) PT Visit Diagnosis: Muscle weakness (generalized) (M62.81);Difficulty in walking, not elsewhere classified (R26.2)     Time: 1610-9604 PT Time Calculation (min) (ACUTE ONLY): 62 min  Charges:  $Therapeutic Exercise: 23-37 mins $Therapeutic Activity: 23-37 mins                     Hendricks Limes, PT 05/20/20, 12:38 PM

## 2020-05-20 NOTE — Progress Notes (Signed)
Physical Therapy Treatment Patient Details Name: Rebecca Peck MRN: [id number] DOB: 08/13/1946 Today's Date: 05/20/2020    History of Present Illness 74 year old female who is household ambulator with a cane who has had trouble with being unsteady with her gait and suffered a fall and subsequent R hip fracture with ORIF IM nailing 6/30.    PT Comments    Pt resting in bed upon PT arrival; pt's son and pt's husband present.  Pt reporting a lot of LE pain but agreeable to therapy session (pt's son requesting to hold pain medication until after therapy so pt was not so sleepy during therapy session).  Mod assist semi-supine to sitting edge of bed; close SBA to CGA with sitting balance; able to stand 2x's with RW and mod assist x2; able to take some small steps (in place and sidestepping to L along bed) with mod assist x2 and use of walker (limited d/t fatigue and d/t LE pain); and 2 assist sit to semi-supine in bed and to reposition for comfort.  Nurse notified end of session pt requesting pain medication.  Will continue to focus on strengthening and progressive functional mobility per pt tolerance.    Follow Up Recommendations  SNF     Equipment Recommendations  3in1 (PT)    Recommendations for Other Services OT consult     Precautions / Restrictions Precautions Precautions: Fall Precaution Comments: Gastric tube; NPO Restrictions Weight Bearing Restrictions: Yes RLE Weight Bearing: Weight bearing as tolerated    Mobility  Bed Mobility Overal bed mobility: Needs Assistance Bed Mobility: Supine to Sit;Sit to Supine     Supine to sit: Mod assist;+2 for safety/equipment;HOB elevated (assist for trunk and R LE semi-supine to sitting edge of bed) Sit to supine: Mod assist;+2 for physical assistance;HOB elevated (assist for trunk and B LE's sit to semi-supine in bed; 2 assist to boost pt up in bed with bed linen)   General bed mobility comments: vc's for  technique  Transfers Overall transfer level: Needs assistance Equipment used: Rolling walker (2 wheeled) Transfers: Sit to/from Stand Sit to Stand: Mod assist;+2 physical assistance         General transfer comment: x2 trials standing from mildly elevated bed with mod assist x2; vc's for UE/LE placement and overall technique; vc's for upright posture in standing  Ambulation/Gait Ambulation/Gait assistance: Mod assist;+2 physical assistance Gait Distance (Feet):  (pt able to take 1 step in place B LE's and then side step to L along bed a few steps before fatiguing and needing to sit down; 2nd trial pt able to take a couple steps to L before fatiguing and needing to sit down on bed) Assistive device: Rolling walker (2 wheeled)   Gait velocity: decreased   General Gait Details: antalgic; decreased stance time R LE making it more difficult to advance L LE (vc's required to increase UE support through RW to offweight LE's with steps)   Stairs             Wheelchair Mobility    Modified Rankin (Stroke Patients Only)       Balance Overall balance assessment: Needs assistance Sitting-balance support: Bilateral upper extremity supported;Feet supported Sitting balance-Leahy Scale: Fair Sitting balance - Comments: pt requiring B UE support for static sitting balance   Standing balance support: Bilateral upper extremity supported Standing balance-Leahy Scale: Poor Standing balance comment: heavy UE support on RW and assist from therapy to maintain standing  Cognition Arousal/Alertness: Awake/alert Behavior During Therapy: Anxious Overall Cognitive Status: Within Functional Limits for tasks assessed                                        Exercises      General Comments   Nursing cleared pt for participation in physical therapy.  Pt agreeable to PT session.      Pertinent Vitals/Pain Pain Assessment: Faces Faces  Pain Scale: Hurts even more (10/10 with mobility; 6/10 at rest) Pain Location: R hip and thigh Pain Descriptors / Indicators: Tender;Sore Pain Intervention(s): Limited activity within patient's tolerance;Monitored during session;Repositioned;Patient requesting pain meds-RN notified    Home Living                      Prior Function            PT Goals (current goals can now be found in the care plan section) Acute Rehab PT Goals Patient Stated Goal: get back home after rehab PT Goal Formulation: With patient Time For Goal Achievement: 05/28/20 Potential to Achieve Goals: Fair Progress towards PT goals: Progressing toward goals    Frequency    BID      PT Plan Current plan remains appropriate    Co-evaluation              AM-PAC PT "6 Clicks" Mobility   Outcome Measure  Help needed turning from your back to your side while in a flat bed without using bedrails?: A Lot Help needed moving from lying on your back to sitting on the side of a flat bed without using bedrails?: A Lot Help needed moving to and from a bed to a chair (including a wheelchair)?: Total Help needed standing up from a chair using your arms (e.g., wheelchair or bedside chair)?: Total Help needed to walk in hospital room?: Total Help needed climbing 3-5 steps with a railing? : Total 6 Click Score: 8    End of Session Equipment Utilized During Treatment: Gait belt Activity Tolerance: Patient limited by pain;Patient limited by fatigue Patient left: in bed;with call bell/phone within reach;with bed alarm set;with family/visitor present;with SCD's reapplied;Other (comment) (B heels floating via pillow support; bed in lowest position) Nurse Communication: Mobility status;Precautions;Patient requests pain meds;Weight bearing status;Other (comment) (purewick needing to be replaced) PT Visit Diagnosis: Muscle weakness (generalized) (M62.81);Difficulty in walking, not elsewhere classified (R26.2)      Time: 0109-3235 PT Time Calculation (min) (ACUTE ONLY): 38 min  Charges:  $Therapeutic Activity: 38-52 mins                     Hendricks Limes, PT 05/20/20, 4:57 PM

## 2020-05-20 NOTE — Progress Notes (Signed)
Writer spoke with general surgery PA about clamping trial as verbalized. NG tube was clamped 4 hours and then put back to intermittent suction. Writer reported to PA minimal output he stated to clamp NG tube overnight and he will reassess in the morning when he makes rounds. PA stated patient can have meds with sips of water and minimal ice chips for comfort. Writer reported to Cabin crew.

## 2020-05-20 NOTE — TOC Progression Note (Signed)
Transition of Care [facility]) - Progression Note    Patient Details  Name: Rebecca Peck MRN: [id number] Date of Birth: [birth year]  [facility] CM/SW Contact  Barrie Dunker, RN Phone Number: 05/20/2020, 8:45 AM  Clinical Narrative:   Patient to go to [facility] when stable, She now has an NGT placed, the insurance auth has expired and it will need to be started again once the patient is medically stable for DC, The patient has not had the Covid vaccines and will need a Covid test before DC         Expected Discharge Plan and Services                                                 Social Determinants of Health (SDOH) Interventions    Readmission Risk Interventions No flowsheet data found.

## 2020-05-21 LAB — BASIC METABOLIC PANEL
Anion gap: 12 (ref 5–15)
BUN: 7 mg/dL — ABNORMAL LOW (ref 8–23)
CO2: 20 mmol/L — ABNORMAL LOW (ref 22–32)
Calcium: 7.9 mg/dL — ABNORMAL LOW (ref 8.9–10.3)
Chloride: 98 mmol/L (ref 98–111)
Creatinine, Ser: 0.53 mg/dL (ref 0.44–1.00)
GFR calc Af Amer: >60 mL/min (ref >60)
GFR calc non Af Amer: >60 mL/min (ref >60)
Glucose, Bld: 77 mg/dL (ref 70–99)
Potassium: 3 mmol/L — ABNORMAL LOW (ref 3.5–5.1)
Sodium: 130 mmol/L — ABNORMAL LOW (ref 135–145)

## 2020-05-21 MED ORDER — BOOST / RESOURCE BREEZE PO LIQD CUSTOM
1.0000 | Freq: Three times a day (TID) | ORAL | Status: DC
  Administered 2020-05-21 – 2020-05-26 (×10): 1 via ORAL

## 2020-05-21 MED ORDER — POTASSIUM CHLORIDE 10 MEQ/100ML IV SOLN
10.0000 meq | INTRAVENOUS | Status: AC
  Administered 2020-05-21 (×4): 10 meq via INTRAVENOUS
  Filled 2020-05-21 (×4): qty 100

## 2020-05-21 NOTE — Progress Notes (Signed)
Physical Therapy Treatment Patient Details Name: Rebecca Peck MRN: [id number] DOB: 1946-10-22 Today's Date: 05/21/2020    History of Present Illness 74 year old female who is household ambulator with a cane who has had trouble with being unsteady with her gait and suffered a fall and subsequent R hip fracture with ORIF IM nailing 6/30.    PT Comments    Pt was long sitting in bed awake upon arriving. She had NG tube removed this morning and reports feeling better than previous date. Pt was agreeable to trial OOB activity.She was able to exit L side of bed with Mod assist. Stood to RW several times with mod assist. Was able to ambulate to recliner (~ 19ft) with slow antalgic step to pattern. Pt does fatigue quickly with minimal SOB noted. Overall pt is progressing but will require continued skilled PT to address deficits with strength, endurance, and overall safe functional mobility. Pt was seated in recliner, with call bell in reach and RN aware.   Follow Up Recommendations  SNF     Equipment Recommendations  3in1 (PT)    Recommendations for Other Services       Precautions / Restrictions Precautions Precautions: Fall Precaution Comments: NG tube removed this morning    Mobility  Bed Mobility Overal bed mobility: Needs Assistance Bed Mobility: Supine to Sit     Supine to sit: Mod assist;HOB elevated     General bed mobility comments: Pt required increased time to exit L side of bed. mod assist to achieve EOB short sit.  Transfers Overall transfer level: Needs assistance Equipment used: Rolling walker (2 wheeled) Transfers: Sit to/from Stand Sit to Stand: Mod assist;From elevated surface         General transfer comment: Pt was able to STS from EOB 2 x prior to ambulating to recliner. Vcs for handplacement and technique improvements  Ambulation/Gait Ambulation/Gait assistance: Min assist;Mod assist   Assistive device: Rolling walker (2 wheeled) Gait  Pattern/deviations: Step-to pattern;Antalgic Gait velocity: decreased   General Gait Details: Pt was able to take steps from EOB to recliner with min-mod assist to lateral wt shift to allow opposite LE advancement   Stairs             Wheelchair Mobility    Modified Rankin (Stroke Patients Only)       Balance Overall balance assessment: Needs assistance Sitting-balance support: Bilateral upper extremity supported;Feet supported Sitting balance-Leahy Scale: Good Sitting balance - Comments: no LOB in sitting EOB/recliner. was able to reach outside BOS without LOB   Standing balance support: Bilateral upper extremity supported Standing balance-Leahy Scale: Fair Standing balance comment: Heavy reliance on RW for safety in standing                            Cognition Arousal/Alertness: Awake/alert Behavior During Therapy: WFL for tasks assessed/performed Overall Cognitive Status: Within Functional Limits for tasks assessed                                 General Comments: Pt was A and O x 3 and agreeable to PT session. Motivated to get OOB this morning. Spouse present at bedside throughout session      Exercises      General Comments        Pertinent Vitals/Pain Faces Pain Scale: Hurts whole lot Pain Location: R hip and thigh Pain Descriptors / Indicators: Tender;Sore  Home Living                      Prior Function            PT Goals (current goals can now be found in the care plan section) Acute Rehab PT Goals Patient Stated Goal: get back home after rehab    Frequency    BID      PT Plan Current plan remains appropriate    Co-evaluation              AM-PAC PT "6 Clicks" Mobility   Outcome Measure  Help needed turning from your back to your side while in a flat bed without using bedrails?: A Lot Help needed moving from lying on your back to sitting on the side of a flat bed without using bedrails?:  A Lot Help needed moving to and from a bed to a chair (including a wheelchair)?: A Lot Help needed standing up from a chair using your arms (e.g., wheelchair or bedside chair)?: A Lot Help needed to walk in hospital room?: A Lot Help needed climbing 3-5 steps with a railing? : Total 6 Click Score: 11    End of Session Equipment Utilized During Treatment: Gait belt Activity Tolerance: Patient tolerated treatment well;Patient limited by fatigue Patient left: in chair;with call bell/phone within reach;with chair alarm set;with family/visitor present Nurse Communication: Mobility status;Precautions;Patient requests pain meds;Weight bearing status;Other (comment) PT Visit Diagnosis: Muscle weakness (generalized) (M62.81);Difficulty in walking, not elsewhere classified (R26.2)     Time:  -     Charges:                        Jetta Lout PTA 05/21/20, 1:28 PM

## 2020-05-21 NOTE — Progress Notes (Signed)
Physical Therapy Treatment Patient Details Name: Rebecca Peck MRN: [id number] DOB: August 02, 1946 Today's Date: 05/21/2020    History of Present Illness 74 year old female who is household ambulator with a cane who has had trouble with being unsteady with her gait and suffered a fall and subsequent R hip fracture with ORIF IM nailing 6/30.    PT Comments    Therapist contacted by RN staff with pt requesting to return to bed after sitting up in recliner x ~ 2 hours. She reports 8/10 pain but does agrees to 2nd PT session. She stood and took steps with RW from recliner to EOB with mod assist + increased time. Constant vcs for LE advancement and improved gait sequencing. Required Max assist to return to long sitting in bed with call bell in reach, bed alarm in place. Therapist issued HEP handout and pt performed. See exercises below. Acute PT continues to recommend DC to SNF when medically stable to address deficits with strength, endurance, and safe functional mobility.   Follow Up Recommendations  SNF     Equipment Recommendations  3in1 (PT)    Recommendations for Other Services       Precautions / Restrictions Precautions Precautions: Fall Precaution Comments: NG tube removed this morning Restrictions Weight Bearing Restrictions: Yes RLE Weight Bearing: Weight bearing as tolerated    Mobility  Bed Mobility Overal bed mobility: Needs Assistance Bed Mobility: Sit to Supine     Supine to sit: Mod assist;HOB elevated Sit to supine: Max assist   General bed mobility comments: Max assist to progress BLEs into bed from EOB short sit.  Transfers Overall transfer level: Needs assistance Equipment used: Rolling walker (2 wheeled) Transfers: Sit to/from Stand Sit to Stand: Mod assist         General transfer comment: Pt performed STS from recliner with mod assist of 1 + gait belt for safety. Need vcs for hand placement and improved  technique.  Ambulation/Gait Ambulation/Gait assistance: Min assist;Mod assist Gait Distance (Feet): 5 Feet Assistive device: Rolling walker (2 wheeled) Gait Pattern/deviations: Step-to pattern;Antalgic Gait velocity: decreased   General Gait Details: pt was able to ambulate from recliner to EOB with slow step to antalgic gait pattern   Stairs             Wheelchair Mobility    Modified Rankin (Stroke Patients Only)       Balance Overall balance assessment: Needs assistance Sitting-balance support: Bilateral upper extremity supported;Feet supported Sitting balance-Leahy Scale: Good Sitting balance - Comments: no LOB in sitting EOB/recliner. was able to reach outside BOS without LOB   Standing balance support: Bilateral upper extremity supported Standing balance-Leahy Scale: Fair Standing balance comment: Heavy reliance on RW for safety in standing                            Cognition Arousal/Alertness: Awake/alert Behavior During Therapy: WFL for tasks assessed/performed Overall Cognitive Status: Within Functional Limits for tasks assessed                                 General Comments: pt was seated in recliner x 2 hours but reports pain and needing to return to bed."      Exercises General Exercises - Lower Extremity Ankle Circles/Pumps: AROM;10 reps Quad Sets: AROM;Strengthening;Right;10 reps;Left Gluteal Sets: AROM;Strengthening;Both;10 reps Short Arc Quad: AAROM;Strengthening;Right;10 reps Heel Slides: AAROM;10 reps;Strengthening;Right;AROM Hip ABduction/ADduction: Strengthening;AAROM;10 reps;AROM;Left  General Comments        Pertinent Vitals/Pain Pain Assessment: 0-10 Pain Score: 8  Faces Pain Scale: Hurts even more Pain Location: R hip and thigh Pain Descriptors / Indicators: Tender;Sore Pain Intervention(s): Limited activity within patient's tolerance;Monitored during session;Premedicated before  session;Repositioned    Home Living                      Prior Function            PT Goals (current goals can now be found in the care plan section) Acute Rehab PT Goals Patient Stated Goal: get back home after rehab Progress towards PT goals: Progressing toward goals    Frequency    BID      PT Plan Current plan remains appropriate    Co-evaluation              AM-PAC PT "6 Clicks" Mobility   Outcome Measure  Help needed turning from your back to your side while in a flat bed without using bedrails?: A Lot Help needed moving from lying on your back to sitting on the side of a flat bed without using bedrails?: A Lot Help needed moving to and from a bed to a chair (including a wheelchair)?: A Lot Help needed standing up from a chair using your arms (e.g., wheelchair or bedside chair)?: A Lot Help needed to walk in hospital room?: A Lot Help needed climbing 3-5 steps with a railing? : Total 6 Click Score: 11    End of Session Equipment Utilized During Treatment: Gait belt Activity Tolerance: Patient tolerated treatment well;Patient limited by fatigue Patient left: in bed;with call bell/phone within reach;with bed alarm set;with nursing/sitter in room;with family/visitor present Nurse Communication: Mobility status;Precautions;Patient requests pain meds;Weight bearing status;Other (comment) PT Visit Diagnosis: Muscle weakness (generalized) (M62.81);Difficulty in walking, not elsewhere classified (R26.2)     Time: 4128-7867 PT Time Calculation (min) (ACUTE ONLY): 18 min  Charges:  $Therapeutic Activity: 8-22 mins                     Jetta Lout PTA 05/21/20, 1:42 PM

## 2020-05-21 NOTE — Progress Notes (Signed)
PROGRESS NOTE    Rebecca CrumbleJennifer S Peck  ZOX:[id number] DOB: 08/21/1946 DOA: 05/12/2020 PCP: Patient, No Pcp Per    Brief Narrative:  Rebecca OsmondJennifer S Pickardis a 74 y.o.femalewith medical history significant forCADwith remote stent placement, hypertension, hyperlipidemia, hypothyroidism, GERD,tobacco use,and chronic back pain who was admitted 05/12/2020 with right hip fracture status post mechanical fall s/p ORIF.  Developed anorexia, nausea and vomiting postoperatively, some concern of ileus but CT abdomen was negative.  NG tube was placed     Consultants:   General surgery  Procedures: NG tube placement  Antimicrobials:       Subjective: NGT out. Sitting in bed. Has NO complaints of pain. No bm. Denies n/v  Objective: Vitals:   05/20/20 1453 05/20/20 2019 05/21/20 0025 05/21/20 0753  BP: 137/73 137/69 (!) 152/76 132/73  Pulse: 81 88 87 75  Resp: 17 16 18 15   Temp: 97.9 F (36.6 C) 98.1 F (36.7 C) 98.3 F (36.8 C) 97.8 F (36.6 C)  TempSrc: Oral Oral Oral Oral  SpO2: 98% 98% 97% 96%  Weight:      Height:        Intake/Output Summary (Last 24 hours) at 05/21/2020 0804 Last data filed at 05/20/2020 1856 Gross per 24 hour  Intake 4467.84 ml  Output 802 ml  Net 3665.84 ml   Filed Weights   05/12/20 1551 05/13/20 1420  Weight: 55.3 kg 55.3 kg    Examination:  General exam: Appears calm and comfortable, sitting in chair, husband at bedside, NAD Respiratory system: Clear to auscultation. Respiratory effort normal.  No wheezes rales rhonchi's Cardiovascular system: S1 & S2 heard, RRR. No JVD, murmurs, rubs, gallops or clicks.  Gastrointestinal system: Abdomen is distended, soft and nontender.  Hypoactive bowel sounds, no rebound Central nervous system: Alert and oriented.  Grossly intact Extremities: No edema Skin: Warm dry Psychiatry: Judgement and insight appear normal. Mood & affect appropriate in current setting.     Data Reviewed: I have personally  reviewed following labs and imaging studies  CBC: Recent Labs  Lab 05/15/20 0619 05/17/20 0426 05/18/20 0701 05/19/20 0503 05/20/20 0540  WBC 7.5 10.2 9.8 9.0 8.9  NEUTROABS  --   --   --  6.8  --   HGB 8.5* 8.2* 8.1* 7.7* 7.8*  HCT 23.8* 24.2* 23.2* 22.9* 23.0*  MCV 85.9 89.3 85.6 88.4 88.8  PLT 175 244 258 287 313   Basic Metabolic Panel: Recent Labs  Lab 05/16/20 0422 05/16/20 0422 05/17/20 0426 05/18/20 0701 05/19/20 0503 05/20/20 0540 05/21/20 0620  NA 123*   < > 126* 124* 126* 129* 130*  K 4.0   < > 4.1 3.3* 3.5 3.2* 3.0*  CL 91*   < > 93* 93* 95* 97* 98  CO2 25   < > 25 22 22 23  20*  GLUCOSE 101*   < > 108* 92 98 86 77  BUN 6*   < > 9 7* 7* 7* 7*  CREATININE 0.50   < > 0.68 0.47 0.52 0.46 0.53  CALCIUM 7.9*   < > 7.9* 7.6* 7.6* 7.6* 7.9*  MG 2.1  --   --   --  2.0 2.1  --    < > = values in this interval not displayed.   GFR: Estimated Creatinine Clearance: 53.9 mL/min (by C-G formula based on SCr of 0.53 mg/dL). Liver Function Tests: Recent Labs  Lab 05/18/20 0701 05/19/20 0503  AST 24  --   ALT 14  --   ALKPHOS  68  --   BILITOT 1.1  --   PROT 6.0*  --   ALBUMIN 2.5* 2.3*   No results for input(s): LIPASE, AMYLASE in the last 168 hours. No results for input(s): AMMONIA in the last 168 hours. Coagulation Profile: No results for input(s): INR, PROTIME in the last 168 hours. Cardiac Enzymes: No results for input(s): CKTOTAL, CKMB, CKMBINDEX, TROPONINI in the last 168 hours. BNP (last 3 results) No results for input(s): PROBNP in the last 8760 hours. HbA1C: No results for input(s): HGBA1C in the last 72 hours. CBG: No results for input(s): GLUCAP in the last 168 hours. Lipid Profile: No results for input(s): CHOL, HDL, LDLCALC, TRIG, CHOLHDL, LDLDIRECT in the last 72 hours. Thyroid Function Tests: No results for input(s): TSH, T4TOTAL, FREET4, T3FREE, THYROIDAB in the last 72 hours. Anemia Panel: No results for input(s): VITAMINB12, FOLATE,  FERRITIN, TIBC, IRON, RETICCTPCT in the last 72 hours. Sepsis Labs: No results for input(s): PROCALCITON, LATICACIDVEN in the last 168 hours.  Recent Results (from the past 240 hour(s))  SARS Coronavirus 2 by RT PCR (hospital order, performed in [facility] hospital lab) Nasopharyngeal Nasopharyngeal Swab     Status: None   Collection Time: 05/12/20  6:25 PM   Specimen: Nasopharyngeal Swab  Result Value Ref Range Status   SARS Coronavirus 2 NEGATIVE NEGATIVE Final    Comment: (NOTE) SARS-CoV-2 target nucleic acids are NOT DETECTED.  The SARS-CoV-2 RNA is generally detectable in upper and lower respiratory specimens during the acute phase of infection. The lowest concentration of SARS-CoV-2 viral copies this assay can detect is 250 copies / mL. A negative result does not preclude SARS-CoV-2 infection and should not be used as the sole basis for treatment or other patient management decisions.  A negative result may occur with improper specimen collection / handling, submission of specimen other than nasopharyngeal swab, presence of viral mutation(s) within the areas targeted by this assay, and inadequate number of viral copies (<250 copies / mL). A negative result must be combined with clinical observations, patient history, and epidemiological information.  Fact Sheet for Patients:   BoilerBrush.com.cy  Fact Sheet for Healthcare Providers: https://pope.com/  This test is not yet approved or  cleared by the [place] FDA and has been authorized for detection and/or diagnosis of SARS-CoV-2 by FDA under an Emergency Use Authorization (EUA).  This EUA will remain in effect (meaning this test can be used) for the duration of the COVID-19 declaration under Section 564(b)(1) of the Act, 21 U.S.C. section 360bbb-3(b)(1), unless the authorization is terminated or revoked sooner.  Performed at [facility], [address] [place], [place] [address]   SARS CORONAVIRUS 2 (TAT 6-24 HRS) Nasopharyngeal Nasopharyngeal Swab     Status: None   Collection Time: 05/18/20  1:03 PM   Specimen: Nasopharyngeal Swab  Result Value Ref Range Status   SARS Coronavirus 2 NEGATIVE NEGATIVE Final    Comment: (NOTE) SARS-CoV-2 target nucleic acids are NOT DETECTED.  The SARS-CoV-2 RNA is generally detectable in upper and lower respiratory specimens during the acute phase of infection. Negative results do not preclude SARS-CoV-2 infection, do not rule out co-infections with other pathogens, and should not be used as the sole basis for treatment or other patient management decisions. Negative results must be combined with clinical observations, patient history, and epidemiological information. The expected result is Negative.  Fact Sheet for Patients: HairSlick.no  Fact Sheet for Healthcare Providers: quierodirigir.com  This test is not yet approved  or cleared by the [place] and  has been authorized for detection and/or diagnosis of SARS-CoV-2 by FDA under an Emergency Use Authorization (EUA). This EUA will remain  in effect (meaning this test can be used) for the duration of the COVID-19 declaration under Se ction 564(b)(1) of the Act, 21 U.S.C. section 360bbb-3(b)(1), unless the authorization is terminated or revoked sooner.  Performed at [facility], [address], [place], [place] [address]          Radiology Studies: DG Abd 1 View  Result Date: 05/20/2020 CLINICAL DATA:  Check nasogastric catheter placement EXAM: ABDOMEN - 1 VIEW COMPARISON:  05/19/2020 FINDINGS: Gastric catheter is again identified within the stomach. Scattered large and small bowel gas is seen and stable. IMPRESSION: Gastric catheter in the stomach. Electronically Signed   By: Alcide Clever M.D.   On: 05/20/2020 03:21   DG Abd 1 View  Result Date: 05/19/2020 CLINICAL  DATA:  Nasogastric tube placement. EXAM: ABDOMEN - 1 VIEW COMPARISON:  Same day. FINDINGS: Mildly dilated large and small bowel loops are noted suggesting ileus. Distal tip of nasogastric tube is seen in expected position of proximal stomach. IMPRESSION: Distal tip of nasogastric tube seen in expected position of proximal stomach. Electronically Signed   By: Lupita Raider M.D.   On: 05/19/2020 12:59   DG Abd 1 View  Result Date: 05/19/2020 CLINICAL DATA:  NG tube placement. EXAM: ABDOMEN - 1 VIEW COMPARISON:  05/12/2020. FINDINGS: Upper abdomen imaged. NG tube noted with tip over the stomach. Side hole at the gastroesophageal junction. Advancement of approximately 10 cm should be considered. Bowel distention again noted. No free air. IMPRESSION: 1. NG tube noted with tip in the upper most portion stomach. Side hole is at the gastroesophageal junction. Advancement of the NG tube approximately 10 cm suggested. 2.  Bowel distention again noted. Electronically Signed   By: Maisie Fus  Register   On: 05/19/2020 11:26        Scheduled Meds: . amLODipine  10 mg Oral Daily  . carvedilol  3.125 mg Oral BID  . Chlorhexidine Gluconate Cloth  6 each Topical Daily  . enoxaparin (LOVENOX) injection  40 mg Subcutaneous Q24H  . ferrous fumarate-b12-vitamic C-folic acid  1 capsule Oral BID  . levothyroxine  125 mcg Oral Daily  . lisinopril  20 mg Oral Daily  . nicotine  21 mg Transdermal Daily  . pantoprazole  40 mg Oral Daily  . polyethylene glycol  17 g Oral Daily  . pravastatin  20 mg Oral Daily   Continuous Infusions: . sodium chloride 75 mL/hr at 05/21/20 0021    Assessment & Plan:   Principal Problem:   Intertrochanteric fracture of right femur, closed, initial encounter [facility]) Active Problems:   Hypothyroidism   Hypertension   Hyperlipidemia   Chronic back pain   CAD (coronary artery disease)   Tobacco use   ileus.  Patient has soft distended abdomen.  CT abdomen done  was without any sign  of small bowel obstruction or ileus.  Still no BM with good bowel regimen.  Patient developed vomiting with worsening abdominal distention. Repeat KUB with ileus.  Most likely secondary to excessive opioid use.  Patient was on high dose oxycodone and using Flexeril 3-4 times a day. -Discontinue scheduled oxycodone which is scheduled for 5 times daily, patient has an history of chronic oxycodone use, per son she used to take 3 to 4 tablets/day, currently taking up to 7 tablets  daily. -Make Flexeril as needed instead of scheduled. -Continue as needed oxycodone-we will use as needed Dilaudid as patient is n.p.o. at this time. Was given one-time dose of methylnaltrexone for a possible opioid-induced constipation. Gsx following, NG tube was discontinued this a.m. started on clears   - Continue IVF  -continue to minimize pain meds - Serial KUBs as needed pending clinical findings     - Continue mobilization with therapies as much as possible   Right hip fracture.  Secondary to mechanical fall.  POD day 8. S/p intramedullary nail placement. PT/OT recommending SNF-patient did had a bed offer but unable to leave due to persistent uncontrolled pain and now ileus.  Chronic hyponatremia.Sodium at her baseline of 129. Today 130. ? 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 Currently at a maintenance fluid of normal saline at 75 cc an hour.   Hypokalemia.  Potassium at 3.0 Will replete and ck am level - magnesium-2.1  Anemia secondary to surgical blood loss.  Hemoglobin at 7.8  -Continue iron supplement. -Continue to monitor  Hypertension.  Blood pressure within goal.   -Continue home dose of carvedilol, amlodipine and lisinopril.  Hyperlipidemia: Continue pravastatin  Hypothyroidism: Continue Synthroid.  Chronic back pain: Was on oxycodone for couple of years.  We will try to back off due to development of ileus.   DVT prophylaxis: Lovenox Code Status: Full Family  Communication: husband at bedside Disposition Plan: Pending SNF when medically stable Status is: Inpatient  Remains inpatient appropriate because:IV treatments appropriate due to intensity of illness or inability to take PO   Dispo:  Patient From: Home  Planned Disposition: Inpatient Rehab  Expected discharge date: 05/22/20  Medically stable for discharge: No             LOS: 9 days   Time spent: 45 min with >50% on coc    Lynn Ito, MD [facility] Pager [phone]  If 7PM-7AM, please contact night-coverage [email or web address] Password [facility] 05/21/2020, 8:04 AM Patient ID: ANGENI CHAUDHURI, female   DOB: 1946/10/19, 74 y.o.   MRN: [id number]

## 2020-05-21 NOTE — Care Management Important Message (Signed)
Important Message  Patient Details  Name: Rebecca Peck MRN: [id number] Date of Birth: 23-Sep-1946   Medicare Important Message Given:  Yes     [name] [name] 05/21/2020, 11:50 AM

## 2020-05-21 NOTE — Progress Notes (Addendum)
[facility] SURGICAL PROGRESS NOTE (cpt [phone])  Hospital Day(s): 9.   Post op day(s): 8 Days Post-Op.   Interval History: Patient seen and examined, no acute events or new complaints overnight. Patient reports she feels better this morning from an abdominal standpoint, denies fever, chills, nausea, emesis, abdominal pain nor distension. Persistent hypokalemia to 3.0 and some degree of hyponatremia at 130 this morning on BMP. NGT clamping trial last night with 100 ccs, NGT was left clamped overnight and residuals this morning on my exam was <50ccs. Again, she has had significant difficulty with ambulation secondary to her orthopedic insults and has continued to need narcotic pain medications.   Review of Systems:  Constitutional: denies fever, chills  HEENT: denies cough or congestion  Respiratory: denies any shortness of breath  Cardiovascular: denies chest pain or palpitations  Gastrointestinal: denies abdominal pain, N/V, or diarrhea/and bowel function as per interval history Genitourinary: denies burning with urination or urinary frequency Musculoskeletal: + RLE Pain   Vital signs in last 24 hours: [min-max] current  Temp:  [97.9 F (36.6 C)-98.4 F (36.9 C)] 98.3 F (36.8 C) (07/08 0025) Pulse Rate:  [78-88] 87 (07/08 0025) Resp:  [16-18] 18 (07/08 0025) BP: (137-152)/(68-76) 152/76 (07/08 0025) SpO2:  [97 %-99 %] 97 % (07/08 0025)     Height: 5\' 6"  (167.6 cm) Weight: 55.3 kg BMI (Calculated): 19.7   Intake/Output last 2 shifts:  07/07 0701 - 07/08 0700 In: 4467.8 [I.V.:4067.8; IV Piggyback:400] Out: 802 [Urine:701; Emesis/NG output:100; Stool:1]   Physical Exam:  Constitutional: alert, cooperative and no distress  HENT: normocephalic without obvious abnormality, NGT in place (I removed this after checking residuals)  Eyes: PERRL, EOM's grossly intact and symmetric  Respiratory: breathing non-labored at rest  Cardiovascular: regular rate and sinus rhythm   Gastrointestinal: Soft, non-tender, and non-distended, no tympany appreciable, no rebound/guarding   Labs:  CBC Latest Ref Rng & Units 05/20/2020 05/19/2020 05/18/2020  WBC 4.0 - 10.5 K/uL 8.9 9.0 9.8  Hemoglobin 12.0 - 15.0 g/dL 7.8(L) 7.7(L) 8.1(L)  Hematocrit 36 - 46 % 23.0(L) 22.9(L) 23.2(L)  Platelets 150 - 400 K/uL 313 287 258   CMP Latest Ref Rng & Units 05/21/2020 05/20/2020 05/19/2020  Glucose 70 - 99 mg/dL 77 86 98  BUN 8 - 23 mg/dL 7(L) 7(L) 7(L)  Creatinine 0.44 - 1.00 mg/dL 07/20/2020 5.00 9.38  Sodium 135 - 145 mmol/L 130(L) 129(L) 126(L)  Potassium 3.5 - 5.1 mmol/L 3.0(L) 3.2(L) 3.5  Chloride 98 - 111 mmol/L 98 97(L) 95(L)  CO2 22 - 32 mmol/L 20(L) 23 22  Calcium 8.9 - 10.3 mg/dL 7.9(L) 7.6(L) 7.6(L)  Total Protein 6.5 - 8.1 g/dL - - -  Total Bilirubin 0.3 - 1.2 mg/dL - - -  Alkaline Phos 38 - 126 U/L - - -  AST 15 - 41 U/L - - -  ALT 0 - 44 U/L - - -     Imaging studies: No new pertinent imaging studies   Assessment/Plan: (ICD-10's: K63.7) 74 y.o. female with clinically resolving post-surgical ileus attributable to major orthopedic surgery, decrease in mobilization, and need for narcotics post-operatively, complicated by pertinent comorbidities including CAD, HTN, HLD.   - I checked residuals this morning during my examination, <50 ccs. I removed NGT at bedside   - I am okay with her staying on CLD today; I added Boost Breeze   - Continue IVF resuscitation  - Replete electrolytes; monitor             -  Pain control prn (minimize narcotics if feasible); antiemetics prn             - Monitor abdominal examination; on-going bowel function             - Continue mobilization with therapies as much as possible              - Further management per priam service; we will follow one more day to ensure she continues to improve  All of the above findings and recommendations were discussed with the patient, patient's family (husband at bedside), and the medical team, and all of  patient's and family's questions were answered to their expressed satisfaction.  -- Lynden Oxford, PA-C [facility] 05/21/2020, 7:23 AM [phone] M-F: 7am - 4pm

## 2020-05-22 LAB — BASIC METABOLIC PANEL WITH GFR
Anion gap: 6 (ref 5–15)
BUN: <5 mg/dL — ABNORMAL LOW (ref 8–23)
CO2: 26 mmol/L (ref 22–32)
Calcium: 7.9 mg/dL — ABNORMAL LOW (ref 8.9–10.3)
Chloride: 95 mmol/L — ABNORMAL LOW (ref 98–111)
Creatinine, Ser: 0.48 mg/dL (ref 0.44–1.00)
GFR calc Af Amer: >60 mL/min
GFR calc non Af Amer: >60 mL/min
Glucose, Bld: 119 mg/dL — ABNORMAL HIGH (ref 70–99)
Potassium: 3.3 mmol/L — ABNORMAL LOW (ref 3.5–5.1)
Sodium: 127 mmol/L — ABNORMAL LOW (ref 135–145)

## 2020-05-22 MED ORDER — ADULT MULTIVITAMIN W/MINERALS CH
1.0000 | ORAL_TABLET | Freq: Every day | ORAL | Status: DC
  Administered 2020-05-23 – 2020-05-27 (×5): 1 via ORAL
  Filled 2020-05-22 (×5): qty 1

## 2020-05-22 MED ORDER — POTASSIUM CHLORIDE 10 MEQ/100ML IV SOLN
10.0000 meq | INTRAVENOUS | Status: AC
  Administered 2020-05-22 (×4): 10 meq via INTRAVENOUS
  Filled 2020-05-22 (×4): qty 100

## 2020-05-22 NOTE — Progress Notes (Signed)
PROGRESS NOTE    Rebecca Peck  JJH:[id number] DOB: January 14, 1946 DOA: 05/12/2020 PCP: Patient, No Pcp Per    Brief Narrative:  Rebecca Raneri Pickardis a 74 y.o.femalewith medical history significant forCADwith remote stent placement, hypertension, hyperlipidemia, hypothyroidism, GERD,tobacco use,and chronic back pain who was admitted 05/12/2020 with right hip fracture status post mechanical fall s/p ORIF.  Developed anorexia, nausea and vomiting postoperatively, some concern of ileus but CT abdomen was negative.  NG tube was placed     Consultants:   General surgery  Procedures: NG tube placement  Antimicrobials:       Subjective: No new complaints. No n/v today. No BM.   Objective: Vitals:   05/21/20 1530 05/21/20 2317 05/22/20 0757 05/22/20 1612  BP: (!) 109/95 (!) 158/94 (!) 135/91 (!) 146/82  Pulse: 86 87 83 90  Resp: 17 18 16 15   Temp: 97.9 F (36.6 C) 98.1 F (36.7 C) 97.9 F (36.6 C) 98.7 F (37.1 C)  TempSrc: Oral Oral Oral Oral  SpO2: 99% 97% 96% 97%  Weight:      Height:        Intake/Output Summary (Last 24 hours) at 05/22/2020 1714 Last data filed at 05/22/2020 1012 Gross per 24 hour  Intake 670.89 ml  Output 4200 ml  Net -3529.11 ml   Filed Weights   05/12/20 1551 05/13/20 1420  Weight: 55.3 kg 55.3 kg    Examination:  General exam: Husband at bedside, laying in bed somewhat tired, pale  respiratory system: CTA, no wheeze rales rhonchi's  Cardiovascular system: Regular S1 & S2  No JVD, murmurs, rubs, gallops or clicks.  Gastrointestinal system: Abd. More distended, soft and nontender.hyperactive bs Central nervous system: Alert and oriented.  Grossly intact. Extremities: No edema or cyanosis Skin: Warm dry Psychiatry:  Mood & affect appropriate in current setting    Data Reviewed: I have personally reviewed following labs and imaging studies  CBC: Recent Labs  Lab 05/17/20 0426 05/18/20 0701 05/19/20 0503 05/20/20 0540  WBC  10.2 9.8 9.0 8.9  NEUTROABS  --   --  6.8  --   HGB 8.2* 8.1* 7.7* 7.8*  HCT 24.2* 23.2* 22.9* 23.0*  MCV 89.3 85.6 88.4 88.8  PLT 244 258 287 313   Basic Metabolic Panel: Recent Labs  Lab 05/16/20 0422 05/17/20 0426 05/18/20 0701 05/19/20 0503 05/20/20 0540 05/21/20 0620 05/22/20 0443  NA 123*   < > 124* 126* 129* 130* 127*  K 4.0   < > 3.3* 3.5 3.2* 3.0* 3.3*  CL 91*   < > 93* 95* 97* 98 95*  CO2 25   < > 22 22 23  20* 26  GLUCOSE 101*   < > 92 98 86 77 119*  BUN 6*   < > 7* 7* 7* 7* <5*  CREATININE 0.50   < > 0.47 0.52 0.46 0.53 0.48  CALCIUM 7.9*   < > 7.6* 7.6* 7.6* 7.9* 7.9*  MG 2.1  --   --  2.0 2.1  --   --    < > = values in this interval not displayed.   GFR: Estimated Creatinine Clearance: 53.9 mL/min (by C-G formula based on SCr of 0.48 mg/dL). Liver Function Tests: Recent Labs  Lab 05/18/20 0701 05/19/20 0503  AST 24  --   ALT 14  --   ALKPHOS 68  --   BILITOT 1.1  --   PROT 6.0*  --   ALBUMIN 2.5* 2.3*   No results for input(s):  LIPASE, AMYLASE in the last 168 hours. No results for input(s): AMMONIA in the last 168 hours. Coagulation Profile: No results for input(s): INR, PROTIME in the last 168 hours. Cardiac Enzymes: No results for input(s): CKTOTAL, CKMB, CKMBINDEX, TROPONINI in the last 168 hours. BNP (last 3 results) No results for input(s): PROBNP in the last 8760 hours. HbA1C: No results for input(s): HGBA1C in the last 72 hours. CBG: No results for input(s): GLUCAP in the last 168 hours. Lipid Profile: No results for input(s): CHOL, HDL, LDLCALC, TRIG, CHOLHDL, LDLDIRECT in the last 72 hours. Thyroid Function Tests: No results for input(s): TSH, T4TOTAL, FREET4, T3FREE, THYROIDAB in the last 72 hours. Anemia Panel: No results for input(s): VITAMINB12, FOLATE, FERRITIN, TIBC, IRON, RETICCTPCT in the last 72 hours. Sepsis Labs: No results for input(s): PROCALCITON, LATICACIDVEN in the last 168 hours.  Recent Results (from the past 240  hour(s))  SARS Coronavirus 2 by RT PCR (hospital order, performed in [facility] hospital lab) Nasopharyngeal Nasopharyngeal Swab     Status: None   Collection Time: 05/12/20  6:25 PM   Specimen: Nasopharyngeal Swab  Result Value Ref Range Status   SARS Coronavirus 2 NEGATIVE NEGATIVE Final    Comment: (NOTE) SARS-CoV-2 target nucleic acids are NOT DETECTED.  The SARS-CoV-2 RNA is generally detectable in upper and lower respiratory specimens during the acute phase of infection. The lowest concentration of SARS-CoV-2 viral copies this assay can detect is 250 copies / mL. A negative result does not preclude SARS-CoV-2 infection and should not be used as the sole basis for treatment or other patient management decisions.  A negative result may occur with improper specimen collection / handling, submission of specimen other than nasopharyngeal swab, presence of viral mutation(s) within the areas targeted by this assay, and inadequate number of viral copies (<250 copies / mL). A negative result must be combined with clinical observations, patient history, and epidemiological information.  Fact Sheet for Patients:   BoilerBrush.com.cy  Fact Sheet for Healthcare Providers: https://pope.com/  This test is not yet approved or  cleared by the [place] FDA and has been authorized for detection and/or diagnosis of SARS-CoV-2 by FDA under an Emergency Use Authorization (EUA).  This EUA will remain in effect (meaning this test can be used) for the duration of the COVID-19 declaration under Section 564(b)(1) of the Act, 21 U.S.C. section 360bbb-3(b)(1), unless the authorization is terminated or revoked sooner.  Performed at [facility], [address] [place], [place] [address]   SARS CORONAVIRUS 2 (TAT 6-24 HRS) Nasopharyngeal Nasopharyngeal Swab     Status: None   Collection Time: 05/18/20  1:03 PM   Specimen: Nasopharyngeal  Swab  Result Value Ref Range Status   SARS Coronavirus 2 NEGATIVE NEGATIVE Final    Comment: (NOTE) SARS-CoV-2 target nucleic acids are NOT DETECTED.  The SARS-CoV-2 RNA is generally detectable in upper and lower respiratory specimens during the acute phase of infection. Negative results do not preclude SARS-CoV-2 infection, do not rule out co-infections with other pathogens, and should not be used as the sole basis for treatment or other patient management decisions. Negative results must be combined with clinical observations, patient history, and epidemiological information. The expected result is Negative.  Fact Sheet for Patients: HairSlick.no  Fact Sheet for Healthcare Providers: quierodirigir.com  This test is not yet approved or cleared by the [place] FDA and  has been authorized for detection and/or diagnosis of SARS-CoV-2 by FDA under an Emergency Use Authorization (EUA).  This EUA will remain  in effect (meaning this test can be used) for the duration of the COVID-19 declaration under Se ction 564(b)(1) of the Act, 21 U.S.C. section 360bbb-3(b)(1), unless the authorization is terminated or revoked sooner.  Performed at [facility], [address] [place], [place] [address]          Radiology Studies: No results found.      Scheduled Meds: . amLODipine  10 mg Oral Daily  . carvedilol  3.125 mg Oral BID  . Chlorhexidine Gluconate Cloth  6 each Topical Daily  . enoxaparin (LOVENOX) injection  40 mg Subcutaneous Q24H  . feeding supplement  1 Container Oral TID BM  . ferrous fumarate-b12-vitamic C-folic acid  1 capsule Oral BID  . levothyroxine  125 mcg Oral Daily  . lisinopril  20 mg Oral Daily  . [START ON 05/23/2020] multivitamin with minerals  1 tablet Oral Daily  . nicotine  21 mg Transdermal Daily  . pantoprazole  40 mg Oral Daily  . polyethylene glycol  17 g Oral Daily  .  pravastatin  20 mg Oral Daily   Continuous Infusions: . sodium chloride 75 mL/hr at 05/21/20 1851    Assessment & Plan:   Principal Problem:   Intertrochanteric fracture of right femur, closed, initial encounter [facility](HCC) Active Problems:   Hypothyroidism   Hypertension   Hyperlipidemia   Chronic back pain   CAD (coronary artery disease)   Tobacco use   ileus.  Patient has soft distended abdomen.  CT abdomen done  was without any sign of small bowel obstruction or ileus.  Still no BM with good bowel regimen.  Patient developed vomiting with worsening abdominal distention. Repeat KUB with ileus.  Most likely secondary to excessive opioid use.  Patient was on high dose oxycodone and using Flexeril 3-4 times a day. -Discontinue scheduled oxycodone which is scheduled for 5 times daily, patient has an history of chronic oxycodone use, per son she used to take 3 to 4 tablets/day, currently taking up to 7 tablets daily. -Make Flexeril as needed instead of scheduled. -Continue as needed oxycodone-we will use as needed Dilaudid as patient is n.p.o. at this time. Was given one-time dose of methylnaltrexone for a possible opioid-induced constipation. Gsx following-NGT d/c'd on 7/8 Today abdomen more distended, surgery recommended continuing clear liquid diet.  If she develops emesis or pain may need to keep her n.p.o. and consider replacing NG tube Will obtain abdominal x-ray in a.m. Continue mobilization Minimize pain meds-I discussed with husband today that they may consider pain management as outpatient as she has been on pain medications for a long time. IV fluids  Right hip fracture.  Secondary to mechanical fall.  POD day 8. S/p intramedullary nail placement. PT/OT recommending SNF-patient did had a bed offer but unable to leave due to persistent uncontrolled pain and now ileus. Minimizing pain medications  Chronic hyponatremia.Sodium at her baseline of 129. Today 127 ?  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 Will need to continue as pt only on clears, if continues to drop then dc ivf   Hypokalemia.  K 3.3 Will supplement with KCl 40 mEq IV x1 Mag was stable at 2.1  Anemia secondary to surgical blood loss.  Hemoglobin at 7.8  -Continue iron supplement. Will ck cbc in am  Hypertension.   Stable   Continue home carvedilol, amlodipine and lisinopril.  Hyperlipidemia: Continue pravastatin  Hypothyroidism: Continue Synthroid.  Chronic back pain: Was on oxycodone for couple  of years.   Discussed with husband about pain management as outpatient. Will need to decrease as causing ileus.    DVT prophylaxis: Lovenox Code Status: Full Family Communication: husband at bedside Disposition Plan: Pending SNF when medically stable Status is: Inpatient  Remains inpatient appropriate because:IV treatments appropriate due to intensity of illness or inability to take PO   Dispo:  Patient From: Home  Planned Disposition: Inpatient Rehab  Expected discharge date: 05/24/20  Medically stable for discharge: No as she may be requiring NG tube placement as she is still not able to tolerate p.o. and requiring IV fluid             LOS: 10 days   Time spent: 45 min with >50% on coc    Lynn Ito, MD [facility] Pager [phone]  If 7PM-7AM, please contact night-coverage [email or web address] Password [email or web address] 05/22/2020, 5:14 PM

## 2020-05-22 NOTE — TOC Progression Note (Signed)
Transition of Care [facility]) - Progression Note    Patient Details  Name: MAAT KAFER MRN: [id number] Date of Birth: 07-Mar-1946  Transition [facility] Care [facility] CM/SW Contact  Barrie Dunker, RN Phone Number: 05/22/2020, 8:39 AM  Clinical Narrative:     The patient is still having abdominal distention, pain, nausea and remains on clear liquid diet today, Will be discharging when medically stable to [facility], A new insurance auth is needed and will be started once more medically stable by [facility] team member, Will need a new Covid test for DC       Expected Discharge Plan and Services                                                 Social Determinants of Health (SDOH) Interventions    Readmission Risk Interventions No flowsheet data found.

## 2020-05-22 NOTE — Progress Notes (Signed)
[facility] SURGICAL PROGRESS NOTE (cpt [phone])  Hospital Day(s): 10.   Post op day(s): 9 Days Post-Op.   Interval History: Patient seen and examined, no acute events or new complaints overnight. Patient reports she feels a little worse this morning and has had some abdominal distension and nausea, denies fever, chills, emesis. Still with complaints of RLE pain which is unchanged from my prior interviews. She is still with hypokalemia to 3.3, but this is improved. Slightly worse hyponatremia from baseline to 127. Her renal function is normal, sCr - 0.48, and she is making plenty of urine with a UO of 3.2L in last 24 hours. Her NGT was removed at bedside yesterday morning and she has been on CLD since that time. She has tolerated this well and is passing flatus. Again, not mobilizing well.   Review of Systems:  Constitutional: denies fever, chills  HEENT: denies cough or congestion  Respiratory: denies any shortness of breath  Cardiovascular: denies chest pain or palpitations  Gastrointestinal: denies abdominal pain, + distension, + nausea, denied Vomiting, or diarrhea/and bowel function as per interval history Genitourinary: denies burning with urination or urinary frequency Musculoskeletal: + RLE Pain   Vital signs in last 24 hours: [min-max] current  Temp:  [97.8 F (36.6 C)-98.1 F (36.7 C)] 98.1 F (36.7 C) (07/08 2317) Pulse Rate:  [75-87] 87 (07/08 2317) Resp:  [15-18] 18 (07/08 2317) BP: (109-158)/(73-95) 158/94 (07/08 2317) SpO2:  [96 %-99 %] 97 % (07/08 2317)     Height: 5\' 6"  (167.6 cm) Weight: 55.3 kg BMI (Calculated): 19.7   Intake/Output last 2 shifts:  07/08 0701 - 07/09 0700 In: 2800.7 [P.O.:120; I.V.:2439.4; IV Piggyback:241.3] Out: 3250 [Urine:3250]   Physical Exam:  Constitutional: alert, cooperative and no distress  HENT: normocephalic without obvious abnormality Eyes: PERRL, EOM's grossly intact and symmetric  Respiratory: breathing  non-labored at rest  Cardiovascular: regular rate and sinus rhythm  Gastrointestinal: Soft, non-tender, she is more distended and tympanic this morning compared to yesterday, no rebound/guarding   Labs:  CBC Latest Ref Rng & Units 05/20/2020 05/19/2020 05/18/2020  WBC 4.0 - 10.5 K/uL 8.9 9.0 9.8  Hemoglobin 12.0 - 15.0 g/dL 7.8(L) 7.7(L) 8.1(L)  Hematocrit 36 - 46 % 23.0(L) 22.9(L) 23.2(L)  Platelets 150 - 400 K/uL 313 287 258   CMP Latest Ref Rng & Units 05/22/2020 05/21/2020 05/20/2020  Glucose 70 - 99 mg/dL 07/21/2020) 77 86  BUN 8 - 23 mg/dL 846(N) 7(L) 7(L)  Creatinine 0.44 - 1.00 mg/dL <6(E 9.52 8.41  Sodium 135 - 145 mmol/L 127(L) 130(L) 129(L)  Potassium 3.5 - 5.1 mmol/L 3.3(L) 3.0(L) 3.2(L)  Chloride 98 - 111 mmol/L 95(L) 98 97(L)  CO2 22 - 32 mmol/L 26 20(L) 23  Calcium 8.9 - 10.3 mg/dL 7.9(L) 7.9(L) 7.6(L)  Total Protein 6.5 - 8.1 g/dL - - -  Total Bilirubin 0.3 - 1.2 mg/dL - - -  Alkaline Phos 38 - 126 U/L - - -  AST 15 - 41 U/L - - -  ALT 0 - 44 U/L - - -    Imaging studies: No new pertinent imaging studies   Assessment/Plan: (ICD-10's: K51.7) 74 y.o. female with improved but I suspect persistent post-surgical ileus attributable to major orthopedic surgery, decrease in mobilization, and need for narcotics post-operatively, complicated by pertinent comorbidities includingCAD, HTN, HLD.   - she is more distended this morning and has not made as much progress as I would like to see before advancing diet. We will remain  on CLD today + supplementation with Boost.   - If she develops any emesis or abdominal pain/distension today we may need to make NPO and consider replacing NGT             - Continue IVF resuscitation; wean as diet advances             - Replete electrolytes; monitor (improving) - Pain control prn (minimize narcotics if feasible); antiemetics prn - Monitor abdominal examination; on-going bowel function - Continue mobilization with  therapies as much as possible - Further management per priam service; we will follow for now to ensure improvement  All of the above findings and recommendations were discussed with the patient, and the medical team, and all of patient's questions were answered to her expressed satisfaction.  -- Lynden Oxford, PA-C [facility] 05/22/2020, 7:14 AM [phone] M-F: 7am - 4pm

## 2020-05-22 NOTE — Progress Notes (Signed)
Physical Therapy Treatment Patient Details Name: Rebecca Peck MRN: [id number] DOB: July 14, 1946 Today's Date: 05/22/2020    History of Present Illness 74 year old female who is household ambulator with a cane who has had trouble with being unsteady with her gait and suffered a fall and subsequent R hip fracture with ORIF IM nailing 6/30.    PT Comments    Pt was long sitting in bed upon arriving. She agrees to PT session and reports feeling much improved from this morning. She reports minimal pain at rest that elevated to 6/10 in wt bearing.At rest, BP 124/85 sao2 99% HR 80. She was able to exit L side of bed with increased time and mod assist. Sat EOB prior to standing to RW with min-mod assist. Ambulated 10 ft with RW + slow, step to, antalgic gait pattern. Pt does fatigue quickly but mostly limited by pain. She was seated in recliner at conclusion of session with call bell in reach, chair alarm in place, and call bell in reach.   Follow Up Recommendations  SNF     Equipment Recommendations  3in1 (PT)       Precautions / Restrictions Precautions Precautions: Fall Restrictions Weight Bearing Restrictions: Yes RLE Weight Bearing: Weight bearing as tolerated    Mobility  Bed Mobility Overal bed mobility: Needs Assistance Bed Mobility: Rolling;Supine to Sit;Sit to Supine Rolling: Min assist;Mod assist   Supine to sit: Mod assist;HOB elevated     General bed mobility comments: Pt was able to exit L side of bed with increased timer and vcs for sequencing. required mod assist to achieve EOB short sit  Transfers Overall transfer level: Needs assistance Equipment used: Rolling walker (2 wheeled) Transfers: Sit to/from Stand Sit to Stand: Min assist;Mod assist;From elevated surface         General transfer comment: VCs for proper hand placement and improved technique. She requires assistance moreso 2/2 to pain then strength  Ambulation/Gait Ambulation/Gait assistance: Min  assist Gait Distance (Feet): 10 Feet Assistive device: Rolling walker (2 wheeled) Gait Pattern/deviations: Step-to pattern;Antalgic Gait velocity: decreased   General Gait Details: Pt was able to ambulate form EOB to recliner(placed 20 ft away) did require chair slide behind her after 10 ft 2/2 to fatigue. slow antalgic step to gait sequencing.   Stairs             Wheelchair Mobility    Modified Rankin (Stroke Patients Only)       Balance Overall balance assessment: Needs assistance Sitting-balance support: Bilateral upper extremity supported;Feet supported Sitting balance-Leahy Scale: Good Sitting balance - Comments: no LOB in sitting EOB/recliner. was able to reach outside BOS without LOB   Standing balance support: Bilateral upper extremity supported Standing balance-Leahy Scale: Fair Standing balance comment: Heavy reliance on RW for safety in standing                            Cognition Arousal/Alertness: Awake/alert Behavior During Therapy: WFL for tasks assessed/performed Overall Cognitive Status: Within Functional Limits for tasks assessed                                 General Comments: Pt was cooperative and pleasant this afternoon. continues to require encouragement to fully participate however gives great effort once motivated      Exercises      General Comments        Pertinent  Vitals/Pain Pain Assessment: 0-10 Pain Score: 6  Faces Pain Scale: Hurts a little bit Pain Location: R hip and thigh Pain Descriptors / Indicators: Tender;Sore Pain Intervention(s): Limited activity within patient's tolerance;Monitored during session;Premedicated before session;Repositioned    Home Living                      Prior Function            PT Goals (current goals can now be found in the care plan section) Acute Rehab PT Goals Patient Stated Goal: get back home  Progress towards PT goals: Progressing toward  goals    Frequency    BID      PT Plan Current plan remains appropriate    Co-evaluation              AM-PAC PT "6 Clicks" Mobility   Outcome Measure  Help needed turning from your back to your side while in a flat bed without using bedrails?: A Lot Help needed moving from lying on your back to sitting on the side of a flat bed without using bedrails?: A Lot Help needed moving to and from a bed to a chair (including a wheelchair)?: A Lot Help needed standing up from a chair using your arms (e.g., wheelchair or bedside chair)?: A Lot Help needed to walk in hospital room?: A Lot Help needed climbing 3-5 steps with a railing? : Total 6 Click Score: 11    End of Session Equipment Utilized During Treatment: Gait belt Activity Tolerance: Patient tolerated treatment well;No increased pain Patient left: in chair;with call bell/phone within reach;with chair alarm set;with family/visitor present Nurse Communication: Mobility status;Precautions;Patient requests pain meds;Weight bearing status;Other (comment) PT Visit Diagnosis: Muscle weakness (generalized) (M62.81);Difficulty in walking, not elsewhere classified (R26.2)     Time: 1400-1430 PT Time Calculation (min) (ACUTE ONLY): 30 min  Charges:  $Gait Training: 8-22 mins $Therapeutic Activity: 8-22 mins                     Jetta Lout PTA 05/22/20, 4:39 PM

## 2020-05-22 NOTE — Progress Notes (Signed)
Initial Nutrition Assessment  DOCUMENTATION CODES:   Severe malnutrition in context of social or environmental circumstances  INTERVENTION:   Boost Breeze po TID, each supplement provides 250 kcal and 9 grams of protein  MVI daily   Recommend TPN if unable to advance diet in the next day or so  Pt likely at high refeed risk  NUTRITION DIAGNOSIS:   Severe Malnutrition related to social / environmental circumstances (chronic poor appetite and oral intake) as evidenced by moderate to severe fat depletions, moderate to severe muscle depletions.  GOAL:   Patient will meet greater than or equal to 90% of their needs  MONITOR:   PO intake, Supplement acceptance, Labs, Weight trends, Skin, I & O's  REASON FOR ASSESSMENT:   NPO/Clear Liquid Diet    ASSESSMENT:   74 y/o female with h/o HTN, HLD and CAD who is admitted with hip fracture after fall now s/p ORIF 6/28 complicated by post op ileus   Met with pt in room today. Pt reports poor appetite and oral intake at baseline. Pt reports that at baseline, she eats one, maybe two meals per day at home; pt does drink chocolate Boost at home. Per chart review, pt was eating 100% of meals up until 7/4 when she developed nausea and abdominal distension. Pt has remained on a liquid diet since 7/4 and is now without adequate nutrition for 6 days. Pt reports continued abdominal distension today that got worse after eating a bowl of broth. Pt reports that she has maybe been eating 25-50% of her clear liquid trays and she is drinking 1 Boost Breeze per day. Pt reports that she was able to get up and walk some in her room today with assistance but that she felt nauseated afterwards. Of note, pt does not like beef broth, Ensure or orange Boost.   Per chart, pt's UBW is ~110lbs. Pt currently up ~12lbs from her UBW.   Medications reviewed and include: lovenox, ferrous fumarate, synthroid, MVI, nicotine, protonix, miralax, NaCl '@75ml' /hr   Labs  reviewed: Na 127(L), K 3.3(L), BUN 5(L)  Hgb 7.8(L), Hct 23.0(L)  NUTRITION - FOCUSED PHYSICAL EXAM:    Most Recent Value  Orbital Region Mild depletion  Upper Arm Region Severe depletion  Thoracic and Lumbar Region Moderate depletion  Buccal Region Moderate depletion  Temple Region Moderate depletion  Clavicle Bone Region Severe depletion  Clavicle and Acromion Bone Region Severe depletion  Scapular Bone Region Severe depletion  Dorsal Hand Severe depletion  Patellar Region Severe depletion  Anterior Thigh Region Severe depletion  Posterior Calf Region Severe depletion  Edema (RD Assessment) Mild  Hair Reviewed  Eyes Reviewed  Mouth Reviewed  Skin Reviewed  Nails Reviewed     Diet Order:   Diet Order            Diet clear liquid Room service appropriate? Yes; Fluid consistency: Thin  Diet effective now                EDUCATION NEEDS:   Education needs have been addressed  Skin:  Skin Assessment: Reviewed RN Assessment (ecchymosis, incision R hip)  Last BM:  7/7- type 7  Height:   Ht Readings from Last 1 Encounters:  05/13/20 '5\' 6"'  (1.676 m)    Weight:   Wt Readings from Last 1 Encounters:  05/13/20 55.3 kg    Ideal Body Weight:  59 kg  BMI:  Body mass index is 19.69 kg/m.  Estimated Nutritional Needs:   Kcal:  1500-1700kcal/day  Protein:  75-85g/day  Fluid:  1.5-1.7L/day  Koleen Distance MS, RD, LDN Please refer to [facility] for RD and/or RD on-call/weekend/after hours pager

## 2020-05-22 NOTE — Progress Notes (Signed)
Physical Therapy Treatment Patient Details Name: Rebecca Peck MRN: [id number] DOB: 02-19-1946 Today's Date: 05/22/2020    History of Present Illness 74 year old female who is household ambulator with a cane who has had trouble with being unsteady with her gait and suffered a fall and subsequent R hip fracture with ORIF IM nailing 6/30.    PT Comments    Pt was long sitting in bed upon arriving. She refused OOB activity even with max encouragement. Eventually agrees to performing there ex in bed. She was able to perform and tolerate listed exercises below. Discussed importance of OOB activity.she does state she will get OOB next session this afternoon. At conclusion of session, pt was long sitting in bed with call bell in reach and bed alarm in place. Spouse at bedside throughout.     Follow Up Recommendations  SNF     Equipment Recommendations  3in1 (PT)    Recommendations for Other Services       Precautions / Restrictions Precautions Precautions: Fall Restrictions Weight Bearing Restrictions: Yes RLE Weight Bearing: Weight bearing as tolerated    Mobility  Bed Mobility               General bed mobility comments: unwilling to get OOB  Transfers                    Ambulation/Gait                 Stairs             Wheelchair Mobility    Modified Rankin (Stroke Patients Only)       Balance                                            Cognition Arousal/Alertness: Awake/alert Behavior During Therapy: WFL for tasks assessed/performed Overall Cognitive Status: Within Functional Limits for tasks assessed                                 General Comments: Pt was A and O x 4 however unwilling to get OOB 2/2 to pain. max encouragement but still unwilling. did agree to performing ther ex in bed.      Exercises General Exercises - Lower Extremity Ankle Circles/Pumps: AROM;10 reps Quad Sets:  AROM;Strengthening;Right;10 reps;Left Gluteal Sets: AROM;Strengthening;Both;10 reps Short Arc Quad: AROM;Strengthening;Right;10 reps Heel Slides: AROM;Right;10 reps Hip ABduction/ADduction: AROM;Strengthening;10 reps Straight Leg Raises: AROM;10 reps;Supine    General Comments        Pertinent Vitals/Pain Pain Assessment: 0-10 Pain Score: 9  Faces Pain Scale: Hurts whole lot Pain Location: R hip and thigh Pain Descriptors / Indicators: Tender;Sore Pain Intervention(s): Limited activity within patient's tolerance;Monitored during session;Premedicated before session;Repositioned    Home Living                      Prior Function            PT Goals (current goals can now be found in the care plan section) Progress towards PT goals: Progressing toward goals    Frequency    BID      PT Plan Current plan remains appropriate    Co-evaluation              AM-PAC PT "6 Clicks" Mobility  Outcome Measure  Help needed turning from your back to your side while in a flat bed without using bedrails?: A Lot Help needed moving from lying on your back to sitting on the side of a flat bed without using bedrails?: A Lot Help needed moving to and from a bed to a chair (including a wheelchair)?: A Lot Help needed standing up from a chair using your arms (e.g., wheelchair or bedside chair)?: A Lot Help needed to walk in hospital room?: A Lot Help needed climbing 3-5 steps with a railing? : Total 6 Click Score: 11    End of Session Equipment Utilized During Treatment: Gait belt Activity Tolerance: Patient tolerated treatment well;No increased pain Patient left: in bed;with call bell/phone within reach;with bed alarm set;with nursing/sitter in room;with family/visitor present Nurse Communication: Mobility status;Precautions;Patient requests pain meds;Weight bearing status;Other (comment) PT Visit Diagnosis: Muscle weakness (generalized) (M62.81);Difficulty in walking,  not elsewhere classified (R26.2)     Time: 1610-9604 PT Time Calculation (min) (ACUTE ONLY): 22 min  Charges:  $Therapeutic Exercise: 8-22 mins                     Jetta Lout PTA 05/22/20, 1:11 PM

## 2020-05-23 ENCOUNTER — Inpatient Hospital Stay: Payer: Medicare HMO

## 2020-05-23 DIAGNOSIS — E43 Unspecified severe protein-calorie malnutrition: Secondary | ICD-10-CM | POA: Insufficient documentation

## 2020-05-23 LAB — BASIC METABOLIC PANEL
Anion gap: 8 (ref 5–15)
BUN: <5 mg/dL — ABNORMAL LOW (ref 8–23)
CO2: 27 mmol/L (ref 22–32)
Calcium: 8.6 mg/dL — ABNORMAL LOW (ref 8.9–10.3)
Chloride: 92 mmol/L — ABNORMAL LOW (ref 98–111)
Creatinine, Ser: 0.68 mg/dL (ref 0.44–1.00)
GFR calc Af Amer: >60 mL/min (ref >60)
GFR calc non Af Amer: >60 mL/min (ref >60)
Glucose, Bld: 112 mg/dL — ABNORMAL HIGH (ref 70–99)
Potassium: 4 mmol/L (ref 3.5–5.1)
Sodium: 127 mmol/L — ABNORMAL LOW (ref 135–145)

## 2020-05-23 LAB — CBC
HCT: 31.2 % — ABNORMAL LOW (ref 36.0–46.0)
Hemoglobin: 10.6 g/dL — ABNORMAL LOW (ref 12.0–15.0)
MCH: 29.7 pg (ref 26.0–34.0)
MCHC: 34 g/dL (ref 30.0–36.0)
MCV: 87.4 fL (ref 80.0–100.0)
Platelets: 478 10*3/uL — ABNORMAL HIGH (ref 150–400)
RBC: 3.57 MIL/uL — ABNORMAL LOW (ref 3.87–5.11)
RDW: 15.6 % — ABNORMAL HIGH (ref 11.5–15.5)
WBC: 10.5 10*3/uL (ref 4.0–10.5)
nRBC: 0 % (ref 0.0–0.2)

## 2020-05-23 NOTE — Progress Notes (Signed)
Physical Therapy Treatment Patient Details Name: Rebecca Peck MRN: [id number] DOB: 1946-07-23 Today's Date: 05/23/2020    History of Present Illness 74 year old female who is household ambulator with a cane who has had trouble with being unsteady with her gait and suffered a fall and subsequent R hip fracture with ORIF IM nailing 6/30.    PT Comments    Pt was long sitting in bed upon arriving. She agrees to PT session with encouragement. PT has been limited with PT 2/2 to nausea and pain. Once motivated to participate pt does give good effort but fatigues quickly. She was able to exit L side of bed with min assist + increased time. Sat EOB until nausea resolved. Stood to RW from elevated bed height and ambulated 8 ft with very slow antalgic gait kinematics.Pt reports need to vomit and pt quickly sits. No vomiting occurred. Pt unwilling to trial more ambulation. She will benefit from continued skilled PT at local SNF to address deficits and return to PLOF.Pt wants to DC to home however therapist discussed safety concerns with doing so. Will see how pt progresses over next few acute PT sessions.   Follow Up Recommendations  SNF;Other (comment) (pt really wants to go home 2/2 to not able to have visitors )     Equipment Recommendations  3in1 (PT)    Recommendations for Other Services       Precautions / Restrictions Precautions Precautions: Fall Precaution Comments: pt refusing replacement of NG tube Restrictions Weight Bearing Restrictions: Yes RLE Weight Bearing: Weight bearing as tolerated    Mobility  Bed Mobility Overal bed mobility: Needs Assistance Bed Mobility: Supine to Sit     Supine to sit: Min assist     General bed mobility comments: MIn assist to exit L side of bed with increased time and use of L armrest. Sat EOB x several minutes 2/2 to c/o nausea  Transfers Overall transfer level: Needs assistance Equipment used: Rolling walker (2 wheeled) Transfers:  Sit to/from Stand Sit to Stand: Min assist;From elevated surface         General transfer comment: Pt was able to STS from elevated bed height with min assist + vcs. Increased time to perform with redirecting required to stay focused on task  Ambulation/Gait Ambulation/Gait assistance: Min assist Gait Distance (Feet): 8 Feet Assistive device: Rolling walker (2 wheeled) Gait Pattern/deviations: Step-to pattern;Antalgic;Trunk flexed Gait velocity: decreased   General Gait Details: pt with very antalgic gait kinematics with fatigue noted quickly. she requires constant encouragement throughout session   Stairs             Wheelchair Mobility    Modified Rankin (Stroke Patients Only)       Balance Overall balance assessment: Needs assistance Sitting-balance support: Bilateral upper extremity supported;Feet supported Sitting balance-Leahy Scale: Good Sitting balance - Comments: no LOB in sitting   Standing balance support: Bilateral upper extremity supported Standing balance-Leahy Scale: Fair Standing balance comment: heavy reliance on RW for support                            Cognition Arousal/Alertness: Awake/alert Behavior During Therapy: WFL for tasks assessed/performed Overall Cognitive Status: Within Functional Limits for tasks assessed                                 General Comments: Pt agreeable to PT session. does require encouragement  for full participation      Exercises      General Comments        Pertinent Vitals/Pain Pain Assessment: 0-10 Pain Score: 9  Faces Pain Scale: Hurts whole lot Pain Location: R hip and thigh Pain Descriptors / Indicators: Tender;Sore Pain Intervention(s): Limited activity within patient's tolerance;Monitored during session;Premedicated before session;Repositioned    Home Living                      Prior Function            PT Goals (current goals can now be found in the  care plan section) Acute Rehab PT Goals Patient Stated Goal: get back home  Progress towards PT goals: Not progressing toward goals - comment (pain/fatigue/GI)    Frequency    BID      PT Plan Current plan remains appropriate    Co-evaluation              AM-PAC PT "6 Clicks" Mobility   Outcome Measure  Help needed turning from your back to your side while in a flat bed without using bedrails?: A Lot Help needed moving from lying on your back to sitting on the side of a flat bed without using bedrails?: A Lot Help needed moving to and from a bed to a chair (including a wheelchair)?: A Lot Help needed standing up from a chair using your arms (e.g., wheelchair or bedside chair)?: A Lot Help needed to walk in hospital room?: A Lot Help needed climbing 3-5 steps with a railing? : Total 6 Click Score: 11    End of Session Equipment Utilized During Treatment: Gait belt Activity Tolerance: Patient limited by pain;Patient limited by fatigue Patient left: in chair;with call bell/phone within reach;with chair alarm set;with family/visitor present Nurse Communication: Mobility status;Precautions;Patient requests pain meds;Weight bearing status;Other (comment) PT Visit Diagnosis: Muscle weakness (generalized) (M62.81);Difficulty in walking, not elsewhere classified (R26.2)     Time: 0623-7628 PT Time Calculation (min) (ACUTE ONLY): 14 min  Charges:  $Therapeutic Activity: 8-22 mins                     Jetta Lout PTA 05/23/20, 4:12 PM

## 2020-05-23 NOTE — Progress Notes (Signed)
PROGRESS NOTE    Rebecca Peck  ENI:[id number] DOB: 02/14/1946 DOA: 05/12/2020 PCP: Patient, No Pcp Per    Brief Narrative:  Rebecca Claude Pickardis a 74 y.o.femalewith medical history significant forCADwith remote stent placement, hypertension, hyperlipidemia, hypothyroidism, GERD,tobacco use,and chronic back pain who was admitted 05/12/2020 with right hip fracture status post mechanical fall s/p ORIF.  Developed anorexia, nausea and vomiting postoperatively, some concern of ileus but CT abdomen was negative.  NG tube was placed     Consultants:   General surgery  Procedures: NG tube placement  Antimicrobials:       Subjective: Vomited this am. abd distended. Denies any new complaints.   Objective: Vitals:   05/21/20 2317 05/22/20 0757 05/22/20 1612 05/22/20 2315  BP: (!) 158/94 (!) 135/91 (!) 146/82 (!) 141/75  Pulse: 87 83 90 87  Resp: 18 16 15 19   Temp: 98.1 F (36.7 C) 97.9 F (36.6 C) 98.7 F (37.1 C) 98.2 F (36.8 C)  TempSrc: Oral Oral Oral Oral  SpO2: 97% 96% 97% 97%  Weight:      Height:        Intake/Output Summary (Last 24 hours) at 05/23/2020 0744 Last data filed at 05/23/2020 0500 Gross per 24 hour  Intake 939.98 ml  Output 2300 ml  Net -1360.02 ml   Filed Weights   05/12/20 1551 05/13/20 1420  Weight: 55.3 kg 55.3 kg    Examination:  General exam: Laying in bed, looks fatigued, husband at bedside respiratory system: CTA, no W/R/or Cardiovascular system: Regular S1-S2 no murmurs  Gastrointestinal system: Soft, distended hypoactive bs, nt Central nervous system: AAxox3  Grossly intact. Extremities: no edema Skin: Warm dry Psychiatry:  Mood & affect appropriate in current setting     Data Reviewed: I have personally reviewed following labs and imaging studies  CBC: Recent Labs  Lab 05/17/20 0426 05/18/20 0701 05/19/20 0503 05/20/20 0540  WBC 10.2 9.8 9.0 8.9  NEUTROABS  --   --  6.8  --   HGB 8.2* 8.1* 7.7* 7.8*  HCT  24.2* 23.2* 22.9* 23.0*  MCV 89.3 85.6 88.4 88.8  PLT 244 258 287 313   Basic Metabolic Panel: Recent Labs  Lab 05/18/20 0701 05/19/20 0503 05/20/20 0540 05/21/20 0620 05/22/20 0443  NA 124* 126* 129* 130* 127*  K 3.3* 3.5 3.2* 3.0* 3.3*  CL 93* 95* 97* 98 95*  CO2 22 22 23  20* 26  GLUCOSE 92 98 86 77 119*  BUN 7* 7* 7* 7* <5*  CREATININE 0.47 0.52 0.46 0.53 0.48  CALCIUM 7.6* 7.6* 7.6* 7.9* 7.9*  MG  --  2.0 2.1  --   --    GFR: Estimated Creatinine Clearance: 53.9 mL/min (by C-G formula based on SCr of 0.48 mg/dL). Liver Function Tests: Recent Labs  Lab 05/18/20 0701 05/19/20 0503  AST 24  --   ALT 14  --   ALKPHOS 68  --   BILITOT 1.1  --   PROT 6.0*  --   ALBUMIN 2.5* 2.3*   No results for input(s): LIPASE, AMYLASE in the last 168 hours. No results for input(s): AMMONIA in the last 168 hours. Coagulation Profile: No results for input(s): INR, PROTIME in the last 168 hours. Cardiac Enzymes: No results for input(s): CKTOTAL, CKMB, CKMBINDEX, TROPONINI in the last 168 hours. BNP (last 3 results) No results for input(s): PROBNP in the last 8760 hours. HbA1C: No results for input(s): HGBA1C in the last 72 hours. CBG: No results for input(s): GLUCAP  in the last 168 hours. Lipid Profile: No results for input(s): CHOL, HDL, LDLCALC, TRIG, CHOLHDL, LDLDIRECT in the last 72 hours. Thyroid Function Tests: No results for input(s): TSH, T4TOTAL, FREET4, T3FREE, THYROIDAB in the last 72 hours. Anemia Panel: No results for input(s): VITAMINB12, FOLATE, FERRITIN, TIBC, IRON, RETICCTPCT in the last 72 hours. Sepsis Labs: No results for input(s): PROCALCITON, LATICACIDVEN in the last 168 hours.  Recent Results (from the past 240 hour(s))  SARS CORONAVIRUS 2 (TAT 6-24 HRS) Nasopharyngeal Nasopharyngeal Swab     Status: None   Collection Time: 05/18/20  1:03 PM   Specimen: Nasopharyngeal Swab  Result Value Ref Range Status   SARS Coronavirus 2 NEGATIVE NEGATIVE Final     Comment: (NOTE) SARS-CoV-2 target nucleic acids are NOT DETECTED.  The SARS-CoV-2 RNA is generally detectable in upper and lower respiratory specimens during the acute phase of infection. Negative results do not preclude SARS-CoV-2 infection, do not rule out co-infections with other pathogens, and should not be used as the sole basis for treatment or other patient management decisions. Negative results must be combined with clinical observations, patient history, and epidemiological information. The expected result is Negative.  Fact Sheet for Patients: HairSlick.no  Fact Sheet for Healthcare Providers: quierodirigir.com  This test is not yet approved or cleared by the [place] FDA and  has been authorized for detection and/or diagnosis of SARS-CoV-2 by FDA under an Emergency Use Authorization (EUA). This EUA will remain  in effect (meaning this test can be used) for the duration of the COVID-19 declaration under Se ction 564(b)(1) of the Act, 21 U.S.C. section 360bbb-3(b)(1), unless the authorization is terminated or revoked sooner.  Performed at [facility], [address] [place], [place] [address]          Radiology Studies: DG Abd 1 View  Result Date: 05/23/2020 CLINICAL DATA:  Ileus EXAM: ABDOMEN - 1 VIEW COMPARISON:  Plain films of the abdomen dated 05/19/2020 and 05/18/2020. CT abdomen dated 05/17/2020 FINDINGS: Continued gaseous distention of the large and small bowel, not significantly changed in appearance. No evidence of free intraperitoneal air seen. Aortic atherosclerosis. Calcified infrarenal abdominal aortic aneurysm better demonstrated on earlier CT abdomen. IMPRESSION: 1. Continued gaseous distention of the large and small bowel, not significantly changed in appearance, compatible with ileus. 2. Infrarenal abdominal aortic aneurysm, with associated atherosclerosis, better demonstrated on earlier  CT abdomen. Electronically Signed   By: Bary Richard M.D.   On: 05/23/2020 06:08        Scheduled Meds: . amLODipine  10 mg Oral Daily  . carvedilol  3.125 mg Oral BID  . Chlorhexidine Gluconate Cloth  6 each Topical Daily  . enoxaparin (LOVENOX) injection  40 mg Subcutaneous Q24H  . feeding supplement  1 Container Oral TID BM  . ferrous fumarate-b12-vitamic C-folic acid  1 capsule Oral BID  . levothyroxine  125 mcg Oral Daily  . lisinopril  20 mg Oral Daily  . multivitamin with minerals  1 tablet Oral Daily  . nicotine  21 mg Transdermal Daily  . pantoprazole  40 mg Oral Daily  . polyethylene glycol  17 g Oral Daily  . pravastatin  20 mg Oral Daily   Continuous Infusions: . sodium chloride 75 mL/hr at 05/21/20 1851    Assessment & Plan:   Principal Problem:   Intertrochanteric fracture of right femur, closed, initial encounter [facility]) Active Problems:   Hypothyroidism   Hypertension   Hyperlipidemia   Chronic back pain  CAD (coronary artery disease)   Tobacco use   ileus.  Patient has soft distended abdomen.  CT abdomen done  was without any sign of small bowel obstruction or ileus.  Still no BM with good bowel regimen.  Patient developed vomiting with worsening abdominal distention. Repeat KUB with ileus.  Most likely secondary to excessive opioid use.  Patient was on high dose oxycodone and using Flexeril 3-4 times a day. -Discontinue scheduled oxycodone which is scheduled for 5 times daily, patient has an history of chronic oxycodone use, per son she used to take 3 to 4 tablets/day, currently taking up to 7 tablets daily. -Make Flexeril as needed instead of scheduled. -Continue as needed oxycodone-we will use as needed Dilaudid as patient is n.p.o. at this time. Was given one-time dose of methylnaltrexone for a possible opioid-induced constipation. Gsx following-NGT d/c'd on 7/8 7/10 repeat xr with ileus.  Patient vomited this AM.  Spoke to general surgery Dr. Tonna Boehringer  he recommended NG tube however patient refused We will continue with conservative therapy with clears.  right hip fracture.  Secondary to mechanical fall.   S/p intramedullary nail placement. PT/OT recommending SNF-patient did had a bed offer but unable to leave due to persistent uncontrolled pain and now ileus. Minimizing pain meds.  Will need to f/u with ortho.   Chronic hyponatremia.Sodium at her baseline of 129. Today sodium 127 ? 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 Plan: DC IV fluids Monitor sodium level   Hypokalemia.  Mag was stable at 2.1 Was supplemented.  Potassium 4.0  Resolved  Anemia secondary to surgical blood loss.  Hemoglobin today 10.6  Will continue iron supplements Continue to monitor CBC  Hypertension.   Currently stable Continue with home carvedilol, amlodipine, lisinopril   Hyperlipidemia: Continue pravastatin  Hypothyroidism: Continue Synthroid.  Chronic back pain: Was on oxycodone for couple of years.   Discussed with husband about pain management as outpatient. Will need to decrease as causing ileus.    DVT prophylaxis: Lovenox Code Status: Full Family Communication: husband at bedside Disposition Plan: Pending SNF when medically stable Status is: Inpatient  Remains inpatient appropriate because:IV treatments appropriate due to intensity of illness or inability to take PO   Dispo:  Patient From: Home  Planned Disposition: Inpatient Rehab  Expected discharge date: To be determined when ileus resolves and able to take p.o. intake  Medically stable for discharge: No as she may be requiring NG tube placement, not tolerating p.o. intake, vomiting, still with ileus            LOS: 11 days   Time spent: 45 min with >50% on coc    Lynn Ito, MD [facility] Pager [phone]  If 7PM-7AM, please contact night-coverage [email or web address] Password [facility] 05/23/2020, 7:44 AMPatient ID: Rebecca Peck,  female   DOB: [birth year], 74 y.o.   MRN: [id number]

## 2020-05-23 NOTE — Progress Notes (Signed)
Subjective:  CC: Rebecca Peck is a 74 y.o. female  Hospital stay day 11, SBO   HPI: Some nausea from trying broth.  Tolerated other liquids well.  Some flatus, possible BM per patient report.  ROS:  General: Denies weight loss, weight gain, fatigue, fevers, chills, and night sweats. Heart: Denies chest pain, palpitations, racing heart, irregular heartbeat, leg pain or swelling, and decreased activity tolerance. Respiratory: Denies breathing difficulty, shortness of breath, wheezing, cough, and sputum. GI: Denies change in appetite, vomiting, constipation, diarrhea, and blood in stool. GU: Denies difficulty urinating, pain with urinating, urgency, frequency, blood in urine.   Objective:   Temp:  [98.2 F (36.8 C)-98.9 F (37.2 C)] 98.9 F (37.2 C) (07/10 0756) Pulse Rate:  [87-91] 91 (07/10 0756) Resp:  [15-19] 15 (07/10 0756) BP: (141-152)/(75-82) 152/78 (07/10 0756) SpO2:  [97 %-100 %] 100 % (07/10 0756)     Height: 5\' 6"  (167.6 cm) Weight: 55.3 kg BMI (Calculated): 19.7   Intake/Output this shift:   Intake/Output Summary (Last 24 hours) at 05/23/2020 0910 Last data filed at 05/23/2020 0500 Gross per 24 hour  Intake 939.98 ml  Output 2300 ml  Net -1360.02 ml    Constitutional :  alert, cooperative, appears stated age and no distress  Respiratory:  clear to auscultation bilaterally  Cardiovascular:  regular rate and rhythm  Gastrointestinal: soft, no guarding, increased distention with tympany, mild RUQ TTP.   Skin: Cool and moist.   Psychiatric: Normal affect, non-agitated, not confused       LABS:  CMP Latest Ref Rng & Units 05/23/2020 05/22/2020 05/21/2020  Glucose 70 - 99 mg/dL 07/22/2020) 938(H) 77  BUN 8 - 23 mg/dL 829(H) <3(Z) 7(L)  Creatinine 0.44 - 1.00 mg/dL <1(I 9.67 8.93  Sodium 135 - 145 mmol/L 127(L) 127(L) 130(L)  Potassium 3.5 - 5.1 mmol/L 4.0 3.3(L) 3.0(L)  Chloride 98 - 111 mmol/L 92(L) 95(L) 98  CO2 22 - 32 mmol/L 27 26 20(L)  Calcium 8.9 - 10.3 mg/dL  8.10) 7.9(L) 7.9(L)  Total Protein 6.5 - 8.1 g/dL - - -  Total Bilirubin 0.3 - 1.2 mg/dL - - -  Alkaline Phos 38 - 126 U/L - - -  AST 15 - 41 U/L - - -  ALT 0 - 44 U/L - - -   CBC Latest Ref Rng & Units 05/23/2020 05/20/2020 05/19/2020  WBC 4.0 - 10.5 K/uL 10.5 8.9 9.0  Hemoglobin 12.0 - 15.0 g/dL 10.6(L) 7.8(L) 7.7(L)  Hematocrit 36 - 46 % 31.2(L) 23.0(L) 22.9(L)  Platelets 150 - 400 K/uL 478(H) 313 287    RADS: N/A Assessment:   SBO.  Report of flatus and ?BM? But abdomen still distended with nausea and minor TTP RUQ.  Patient states abd pain is about the same.  Will continue on clears for today to monitor for improving distention and TTP.  Encouraged gum and hard candy, avoid broth.

## 2020-05-23 NOTE — Progress Notes (Signed)
PT Cancellation Note  Patient Details Name: Rebecca Peck MRN: [id number] DOB: Aug 01, 1946   Cancelled Treatment:     PT attempt. Upon entering room pt was actively vomiting. Will hold PT until later time when pt is more appropriate. RN/RN tech aware.    Rushie Chestnut 05/23/2020, 9:08 AM

## 2020-05-23 NOTE — Progress Notes (Signed)
This nurse attempted to insert NG tube per doctors orders but patient refused. Tried to educated the need and improved outcome but patient said she is "not having any tube put up her nose". Notified doctor.

## 2020-05-24 MED ORDER — FLEET ENEMA 7-19 GM/118ML RE ENEM
1.0000 | ENEMA | Freq: Two times a day (BID) | RECTAL | Status: DC
  Administered 2020-05-24 – 2020-05-27 (×5): 1 via RECTAL

## 2020-05-24 MED ORDER — FLEET ENEMA 7-19 GM/118ML RE ENEM
1.0000 | ENEMA | Freq: Every day | RECTAL | Status: DC
  Administered 2020-05-24: 1 via RECTAL

## 2020-05-24 MED ORDER — PREGABALIN 25 MG PO CAPS
25.0000 mg | ORAL_CAPSULE | Freq: Every day | ORAL | Status: DC
  Administered 2020-05-25 – 2020-05-27 (×3): 25 mg via ORAL
  Filled 2020-05-24 (×6): qty 1

## 2020-05-24 NOTE — Plan of Care (Signed)
  Problem: Education: Goal: Knowledge of General Education information will improve Description: Including pain rating scale, medication(s)/side effects and non-pharmacologic comfort measures Outcome: Progressing   Problem: Clinical Measurements: Goal: Will remain free from infection Outcome: Progressing Goal: Cardiovascular complication will be avoided Outcome: Progressing   

## 2020-05-24 NOTE — Progress Notes (Signed)
Subjective:  CC: Rebecca Peck is a 74 y.o. female  Hospital stay day 12, SBO   HPI: One episode of emesis yesterday but had multiple BMs afterwards and tolerated clears with no further issues.  No pain, no nausea this am.  ROS:  General: Denies weight loss, weight gain, fatigue, fevers, chills, and night sweats. Heart: Denies chest pain, palpitations, racing heart, irregular heartbeat, leg pain or swelling, and decreased activity tolerance. Respiratory: Denies breathing difficulty, shortness of breath, wheezing, cough, and sputum. GI: Denies change in appetite, vomiting, constipation, diarrhea, and blood in stool. GU: Denies difficulty urinating, pain with urinating, urgency, frequency, blood in urine.   Objective:   Temp:  [98 F (36.7 C)-98.7 F (37.1 C)] 98 F (36.7 C) (07/11 0812) Pulse Rate:  [86-91] 87 (07/11 0812) Resp:  [15-17] 15 (07/11 0812) BP: (133-143)/(80-82) 136/80 (07/11 0812) SpO2:  [98 %-100 %] 99 % (07/11 0812)     Height: 5\' 6"  (167.6 cm) Weight: 55.3 kg BMI (Calculated): 19.7   Intake/Output this shift:   Intake/Output Summary (Last 24 hours) at 05/24/2020 1130 Last data filed at 05/24/2020 0958 Gross per 24 hour  Intake 0 ml  Output 900 ml  Net -900 ml    Constitutional :  alert, cooperative, appears stated age and no distress  Respiratory:  clear to auscultation bilaterally  Cardiovascular:  regular rate and rhythm  Gastrointestinal: soft, no guarding, Still with some distention with tympany, but no TTP.   Skin: Cool and moist.   Psychiatric: Normal affect, non-agitated, not confused       LABS:  CMP Latest Ref Rng & Units 05/23/2020 05/22/2020 05/21/2020  Glucose 70 - 99 mg/dL 07/22/2020) 166(A) 77  BUN 8 - 23 mg/dL 630(Z) <6(W) 7(L)  Creatinine 0.44 - 1.00 mg/dL <1(U 9.32 3.55  Sodium 135 - 145 mmol/L 127(L) 127(L) 130(L)  Potassium 3.5 - 5.1 mmol/L 4.0 3.3(L) 3.0(L)  Chloride 98 - 111 mmol/L 92(L) 95(L) 98  CO2 22 - 32 mmol/L 27 26 20(L)   Calcium 8.9 - 10.3 mg/dL 7.32) 7.9(L) 7.9(L)  Total Protein 6.5 - 8.1 g/dL - - -  Total Bilirubin 0.3 - 1.2 mg/dL - - -  Alkaline Phos 38 - 126 U/L - - -  AST 15 - 41 U/L - - -  ALT 0 - 44 U/L - - -   CBC Latest Ref Rng & Units 05/23/2020 05/20/2020 05/19/2020  WBC 4.0 - 10.5 K/uL 10.5 8.9 9.0  Hemoglobin 12.0 - 15.0 g/dL 10.6(L) 7.8(L) 7.7(L)  Hematocrit 36 - 46 % 31.2(L) 23.0(L) 22.9(L)  Platelets 150 - 400 K/uL 478(H) 313 287    RADS: N/A Assessment:   SBO.  Now with BMs.  Should be ok to advance to full liquid and see if she does better.  Still has distention on exam so will be slow at advancing

## 2020-05-24 NOTE — Plan of Care (Signed)

## 2020-05-24 NOTE — Progress Notes (Signed)
Subjective: Patient not moving much secondary to pain with chronic underlying pain being part of the problem.  Encouraged her to try to walk more but she says she has trouble getting up.   Objective: Vital signs in last 24 hours: Temp:  [98 F (36.7 C)-98.7 F (37.1 C)] 98 F (36.7 C) (07/11 0812) Pulse Rate:  [86-91] 87 (07/11 0812) Resp:  [15-17] 15 (07/11 0812) BP: (133-143)/(80-82) 136/80 (07/11 0812) SpO2:  [98 %-100 %] 99 % (07/11 0812)  Intake/Output from previous day: 07/10 0701 - 07/11 0700 In: 0  Out: 200 [Urine:200] Intake/Output this shift: Total I/O In: 0  Out: 700 [Urine:700]  Recent Labs    05/23/20 0805  HGB 10.6*   Recent Labs    05/23/20 0805  WBC 10.5  RBC 3.57*  HCT 31.2*  PLT 478*   Recent Labs    05/22/20 0443 05/23/20 0805  NA 127* 127*  K 3.3* 4.0  CL 95* 92*  CO2 26 27  BUN <5* <5*  CREATININE 0.48 0.68  GLUCOSE 119* 112*  CALCIUM 7.9* 8.6*   No results for input(s): LABPT, INR in the last 72 hours.  Neurologically intact Dressing dry  Assessment/Plan: Slow recovery from hip fracture with postop ileus.  That seems to be slowly improving.  Very slow with therapy secondary to pain and chronic pain issues. She will need rehab stay when able to take p.o. adequately to be discharged.   Kennedy Bucker 05/24/2020, 11:50 AM

## 2020-05-24 NOTE — Progress Notes (Signed)
PT Cancellation Note  Patient Details Name: Rebecca Peck MRN: [id number] DOB: 03-24-1946   Cancelled Treatment:    Reason Eval/Treat Not Completed: Medical issues which prohibited therapy;Pain limiting ability to participate   Attempted x 2 this am.  Pt yelling out in pain most of morning.  RN aware of request for pain meds and Miralax.  Will continue as appropriate today but anticipate continue tomorrow.   Danielle Dess 05/24/2020, 10:23 AM

## 2020-05-24 NOTE — Progress Notes (Addendum)
PROGRESS NOTE    Rebecca Peck  YPP:[id number] DOB: 06/04/1946 DOA: 05/12/2020 PCP: Patient, No Pcp Per    Brief Narrative:  Rebecca Wimbush Pickardis a 74 y.o.femalewith medical history significant forCADwith remote stent placement, hypertension, hyperlipidemia, hypothyroidism, GERD,tobacco use,and chronic back pain who was admitted 05/12/2020 with right hip fracture status post mechanical fall s/p ORIF.  Developed anorexia, nausea and vomiting postoperatively, some concern of ileus but CT abdomen was negative.  NG tube was placed     Consultants:   General surgery  Procedures: NG tube placement  Antimicrobials:       Subjective: Had big bowel movement. No n/v/or abd pain.  No distention mildly decreased She had refused NG tube yesterday is complaining of leg spasm husband at bedside  Objective: Vitals:   05/23/20 0756 05/23/20 1702 05/23/20 2334 05/24/20 0812  BP: (!) 152/78 133/81 (!) 143/82 136/80  Pulse: 91 91 86 87  Resp: 15 17 16 15   Temp: 98.9 F (37.2 C) 98.7 F (37.1 C) 98.4 F (36.9 C) 98 F (36.7 C)  TempSrc: Oral Oral Oral Oral  SpO2: 100% 100% 98% 99%  Weight:      Height:        Intake/Output Summary (Last 24 hours) at 05/24/2020 0817 Last data filed at 05/24/2020 07/25/2020 Gross per 24 hour  Intake 0 ml  Output 900 ml  Net -900 ml   Filed Weights   05/12/20 1551 05/13/20 1420  Weight: 55.3 kg 55.3 kg    Examination:  General exam: Laying in bed, NAD husband at bedside respiratory system: CTA, no wheeze rales rhonchi's Cardiovascular system: Regular S1-S2 no murmurs Gastrointestinal system: Soft, nontender, hypoactive bowel sounds, less distended Central nervous system: AAxox3  Grossly intact. Extremities: No edema or cyanosis Skin: Warm dry Psychiatry:  Mood & affect appropriate in current setting     Data Reviewed: I have personally reviewed following labs and imaging studies  CBC: Recent Labs  Lab 05/18/20 0701 05/19/20 0503  05/20/20 0540 05/23/20 0805  WBC 9.8 9.0 8.9 10.5  NEUTROABS  --  6.8  --   --   HGB 8.1* 7.7* 7.8* 10.6*  HCT 23.2* 22.9* 23.0* 31.2*  MCV 85.6 88.4 88.8 87.4  PLT 258 287 313 478*   Basic Metabolic Panel: Recent Labs  Lab 05/19/20 0503 05/20/20 0540 05/21/20 0620 05/22/20 0443 05/23/20 0805  NA 126* 129* 130* 127* 127*  K 3.5 3.2* 3.0* 3.3* 4.0  CL 95* 97* 98 95* 92*  CO2 22 23 20* 26 27  GLUCOSE 98 86 77 119* 112*  BUN 7* 7* 7* <5* <5*  CREATININE 0.52 0.46 0.53 0.48 0.68  CALCIUM 7.6* 7.6* 7.9* 7.9* 8.6*  MG 2.0 2.1  --   --   --    GFR: Estimated Creatinine Clearance: 53.9 mL/min (by C-G formula based on SCr of 0.68 mg/dL). Liver Function Tests: Recent Labs  Lab 05/18/20 0701 05/19/20 0503  AST 24  --   ALT 14  --   ALKPHOS 68  --   BILITOT 1.1  --   PROT 6.0*  --   ALBUMIN 2.5* 2.3*   No results for input(s): LIPASE, AMYLASE in the last 168 hours. No results for input(s): AMMONIA in the last 168 hours. Coagulation Profile: No results for input(s): INR, PROTIME in the last 168 hours. Cardiac Enzymes: No results for input(s): CKTOTAL, CKMB, CKMBINDEX, TROPONINI in the last 168 hours. BNP (last 3 results) No results for input(s): PROBNP in the last  8760 hours. HbA1C: No results for input(s): HGBA1C in the last 72 hours. CBG: No results for input(s): GLUCAP in the last 168 hours. Lipid Profile: No results for input(s): CHOL, HDL, LDLCALC, TRIG, CHOLHDL, LDLDIRECT in the last 72 hours. Thyroid Function Tests: No results for input(s): TSH, T4TOTAL, FREET4, T3FREE, THYROIDAB in the last 72 hours. Anemia Panel: No results for input(s): VITAMINB12, FOLATE, FERRITIN, TIBC, IRON, RETICCTPCT in the last 72 hours. Sepsis Labs: No results for input(s): PROCALCITON, LATICACIDVEN in the last 168 hours.  Recent Results (from the past 240 hour(s))  SARS CORONAVIRUS 2 (TAT 6-24 HRS) Nasopharyngeal Nasopharyngeal Swab     Status: None   Collection Time: 05/18/20   1:03 PM   Specimen: Nasopharyngeal Swab  Result Value Ref Range Status   SARS Coronavirus 2 NEGATIVE NEGATIVE Final    Comment: (NOTE) SARS-CoV-2 target nucleic acids are NOT DETECTED.  The SARS-CoV-2 RNA is generally detectable in upper and lower respiratory specimens during the acute phase of infection. Negative results do not preclude SARS-CoV-2 infection, do not rule out co-infections with other pathogens, and should not be used as the sole basis for treatment or other patient management decisions. Negative results must be combined with clinical observations, patient history, and epidemiological information. The expected result is Negative.  Fact Sheet for Patients: HairSlick.no  Fact Sheet for Healthcare Providers: quierodirigir.com  This test is not yet approved or cleared by the [place] FDA and  has been authorized for detection and/or diagnosis of SARS-CoV-2 by FDA under an Emergency Use Authorization (EUA). This EUA will remain  in effect (meaning this test can be used) for the duration of the COVID-19 declaration under Se ction 564(b)(1) of the Act, 21 U.S.C. section 360bbb-3(b)(1), unless the authorization is terminated or revoked sooner.  Performed at [facility], [address] [place], [place] [address]          Radiology Studies: DG Abd 1 View  Result Date: 05/23/2020 CLINICAL DATA:  Ileus EXAM: ABDOMEN - 1 VIEW COMPARISON:  Plain films of the abdomen dated 05/19/2020 and 05/18/2020. CT abdomen dated 05/17/2020 FINDINGS: Continued gaseous distention of the large and small bowel, not significantly changed in appearance. No evidence of free intraperitoneal air seen. Aortic atherosclerosis. Calcified infrarenal abdominal aortic aneurysm better demonstrated on earlier CT abdomen. IMPRESSION: 1. Continued gaseous distention of the large and small bowel, not significantly changed in appearance,  compatible with ileus. 2. Infrarenal abdominal aortic aneurysm, with associated atherosclerosis, better demonstrated on earlier CT abdomen. Electronically Signed   By: Bary Richard M.D.   On: 05/23/2020 06:08        Scheduled Meds:  amLODipine  10 mg Oral Daily   carvedilol  3.125 mg Oral BID   Chlorhexidine Gluconate Cloth  6 each Topical Daily   enoxaparin (LOVENOX) injection  40 mg Subcutaneous Q24H   feeding supplement  1 Container Oral TID BM   ferrous fumarate-b12-vitamic C-folic acid  1 capsule Oral BID   levothyroxine  125 mcg Oral Daily   lisinopril  20 mg Oral Daily   multivitamin with minerals  1 tablet Oral Daily   nicotine  21 mg Transdermal Daily   pantoprazole  40 mg Oral Daily   polyethylene glycol  17 g Oral Daily   pravastatin  20 mg Oral Daily   Continuous Infusions:   Assessment & Plan:   Principal Problem:   Intertrochanteric fracture of right femur, closed, initial encounter [facility]) Active Problems:   Hypothyroidism  Hypertension   Hyperlipidemia   Chronic back pain   CAD (coronary artery disease)   Tobacco use   Protein-calorie malnutrition, severe   ileus.  Patient has soft distended abdomen.  CT abdomen done  was without any sign of small bowel obstruction or ileus.  Still no BM with good bowel regimen.  Patient developed vomiting with worsening abdominal distention. Repeat KUB with ileus.  Most likely secondary to excessive opioid use.  Patient was on high dose oxycodone and using Flexeril 3-4 times a day. -Discontinue scheduled oxycodone which is scheduled for 5 times daily, patient has an history of chronic oxycodone use, per son she used to take 3 to 4 tablets/day, currently taking up to 7 tablets daily. -Make Flexeril as needed instead of scheduled. -Continue as needed oxycodone-we will use as needed Dilaudid as patient is n.p.o. at this time. Was given one-time dose of methylnaltrexone for a possible opioid-induced  constipation. Gsx following-NGT d/c'd on 7/8 7/10 repeat xr with ileus. Refused NGT to be replaced back 7/11 clinically improving, had a bowel movement  Will continue with aggressive bowel regimen add enema  Monitor for symptoms  Continue to Minimize pain meds Continue with clears  right hip fracture.  Secondary to mechanical fall.   S/p intramedullary nail placement. PT/OT recommending SNF-patient did had a bed offer but unable to leave due to persistent uncontrolled pain and now ileus. Minimizing pain meds.  Plan: Rehab pending-d/c when ileus resolves F/u ortho as outpt  Chronic hyponatremia.Sodium at her baseline of 129. NA 127  ? 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 No labs this am ivf d/c'd  Plan: Monitor levels.   Hypokalemia.  Mag was stable at 2.1 Was supplemented.  Potassium 4.0  Resolved  Anemia secondary to surgical blood loss.  Hemoglobin today 10.6 Continue iron supplement Monitor labs  Hypertension.   Currently stable Continue with home carvedilol, amlodipine, lisinopril   Hyperlipidemia: Continue pravastatin  Hypothyroidism: Continue Synthroid  Chronic back pain: Was on oxycodone for couple of years.   Discussed with husband about pain management as outpatient. Will add Lyrica 25mg  qd for her leg spasm/pain    DVT prophylaxis: Lovenox Code Status: Full Family Communication: husband at bedside Disposition Plan: Pending SNF when medically stable Status is: Inpatient  Remains inpatient appropriate because:IV treatments appropriate due to intensity of illness or inability to take PO   Dispo:  Patient From: Home  Planned Disposition: Inpatient Rehab  Expected discharge date: To be determined when ileus resolves and able to take p.o. intake  Medically stable for discharge: No as patient with ileus and unable to tolerate p.o. intake yet           LOS: 12 days   Time spent: 45 min with >50% on coc    , MD [facility] Pager [phone]  If 7PM-7AM, please contact night-coverage [email or web address] Password [facility] 05/24/2020, 8:17 AM

## 2020-05-24 NOTE — TOC Progression Note (Signed)
Transition of Care [facility]) - Progression Note    Patient Details  Name: Rebecca Peck MRN: [id number] Date of Birth: 1946-10-14  Transition of Care [facility]) CM/SW Contact  Maud Deed, LCSW Phone Number: 05/24/2020, 12:16 PM  Clinical Narrative:    CSW started new insurance authorization.  TOC will continue to follow        Expected Discharge Plan and Services                                                 Social Determinants of Health (SDOH) Interventions    Readmission Risk Interventions No flowsheet data found.

## 2020-05-25 ENCOUNTER — Inpatient Hospital Stay: Payer: Medicare HMO

## 2020-05-25 LAB — SODIUM: Sodium: 127 mmol/L — ABNORMAL LOW (ref 135–145)

## 2020-05-25 NOTE — Progress Notes (Signed)
[facility] SURGICAL PROGRESS NOTE (cpt [phone])  Hospital Day(s): 13.   Post op day(s): 12 Days Post-Op.   Interval History: Patient seen and examined, no acute events or new complaints overnight. Patient reports her abdominal pain and disternsion feel improved, denies fever, chills, nausea, emesis. Still with RLE pain and chronic pain. Advanced to full liquids yesterday. Tolerating well. She did have 6 bowel movements in the last 24 hours. Responding to bowel regimen. Continues to have ambulation issues.   Review of Systems:  Constitutional: denies fever, chills  HEENT: denies cough or congestion  Respiratory: denies any shortness of breath  Cardiovascular: denies chest pain or palpitations  Gastrointestinal: denies abdominal pain, N/V, or diarrhea/and bowel function as per interval history Genitourinary: denies burning with urination or urinary frequency Musculoskeletal: + RLE pain  Vital signs in last 24 hours: [min-max] current  Temp:  [98 F (36.7 C)-98.7 F (37.1 C)] 98.4 F (36.9 C) (07/11 2358) Pulse Rate:  [87] 87 (07/11 2358) Resp:  [15-16] 16 (07/11 2358) BP: (127-137)/(75-80) 137/77 (07/11 2358) SpO2:  [97 %-99 %] 97 % (07/11 2358)     Height: 5\' 6"  (167.6 cm) Weight: 55.3 kg BMI (Calculated): 19.7   Intake/Output last 2 shifts:  07/11 0701 - 07/12 0700 In: 0  Out: 850 [Urine:850]   Physical Exam:  Constitutional: alert, cooperative and no distress  HENT: normocephalic without obvious abnormality  Eyes: PERRL, EOM's grossly intact and symmetric  Respiratory: breathing non-labored at rest  Cardiovascular: regular rate and sinus rhythm  Gastrointestinal: Soft, non-tender, distension and tympany improved, no rebound/guarding  Labs:  CBC Latest Ref Rng & Units 05/23/2020 05/20/2020 05/19/2020  WBC 4.0 - 10.5 K/uL 10.5 8.9 9.0  Hemoglobin 12.0 - 15.0 g/dL 10.6(L) 7.8(L) 7.7(L)  Hematocrit 36 - 46 % 31.2(L) 23.0(L) 22.9(L)  Platelets 150 - 400 K/uL  478(H) 313 287   CMP Latest Ref Rng & Units 05/25/2020 05/23/2020 05/22/2020  Glucose 70 - 99 mg/dL - 07/23/2020) 854(O)  BUN 8 - 23 mg/dL - 270(J) <5(K)  Creatinine 0.44 - 1.00 mg/dL - <0(X 3.81  Sodium 8.29 - 145 mmol/L 127(L) 127(L) 127(L)  Potassium 3.5 - 5.1 mmol/L - 4.0 3.3(L)  Chloride 98 - 111 mmol/L - 92(L) 95(L)  CO2 22 - 32 mmol/L - 27 26  Calcium 8.9 - 10.3 mg/dL - 8.6(L) 7.9(L)  Total Protein 6.5 - 8.1 g/dL - - -  Total Bilirubin 0.3 - 1.2 mg/dL - - -  Alkaline Phos 38 - 126 U/L - - -  AST 15 - 41 U/L - - -  ALT 0 - 44 U/L - - -     Imaging studies: No new pertinent imaging studies   Assessment/Plan: (ICD-10's: K44.7) 74 y.o. female with improved post-surgical ileus attributable to major orthopedic surgery, decrease in mobilization, and need for narcotics post-operatively, complicated by pertinent comorbidities includingCAD, HTN, HLD   - Continue full liquids for breakfast, if tolerating then I am okay with soft diet    - continue bowel regimen   - Pain control prn (minimize narcotics if feasible); antiemetics prn - Monitor abdominal examination; on-going bowel function - Continue mobilization with therapies as much as possible - Further management per priam service   - Ileus resolving, ADAT, general surgery will sign off, please call with questions/concerns. Disposition per primary service.  All of the above findings and recommendations were discussed with the patient, and the medical team, and all of patient's questions were answered to her expressed satisfaction.  --  Lynden Oxford, PA-C  [facility] 05/25/2020, 7:39 AM [phone] M-F: 7am - 4pm

## 2020-05-25 NOTE — TOC Progression Note (Signed)
Transition of Care [facility]) - Progression Note    Patient Details  Name: Rebecca Peck MRN: [id number] Date of Birth: [birth year]  Transition of Care [facility]) CM/SW Contact  Barrie Dunker, RN Phone Number: 05/25/2020, 10:54 AM  Clinical Narrative:     Insurance auth approval received ref number [id number], Milinda Pointer [id number], next review 7/14, Notified the physician        Expected Discharge Plan and Services                                                 Social Determinants of Health (SDOH) Interventions    Readmission Risk Interventions No flowsheet data found.

## 2020-05-25 NOTE — Progress Notes (Signed)
PROGRESS NOTE    Rebecca Peck  NUU:[id number] DOB: 04/03/1946 DOA: 05/12/2020 PCP: Patient, No Pcp Per    Brief Narrative:  Rebecca Lythgoe Pickardis a 74 y.o.femalewith medical history significant forCADwith remote stent placement, hypertension, hyperlipidemia, hypothyroidism, GERD,tobacco use,and chronic back pain who was admitted 05/12/2020 with right hip fracture status post mechanical fall s/p ORIF.  Developed anorexia, nausea and vomiting postoperatively, some concern of ileus but CT abdomen was negative.  NG tube was placed     Consultants:   General surgery  Procedures: NG tube placement  Antimicrobials:       Subjective: Having BM now. Tolerating clears. No comlaints other than she felt weak when up with PT.  Objective: Vitals:   05/24/20 1520 05/24/20 2215 05/24/20 2358 05/25/20 0820  BP: 127/75 131/75 137/77 113/70  Pulse: 87 87 87 86  Resp: 16  16 15   Temp: 98.7 F (37.1 C)  98.4 F (36.9 C) 97.7 F (36.5 C)  TempSrc: Oral  Oral Oral  SpO2: 99%  97% 97%  Weight:      Height:        Intake/Output Summary (Last 24 hours) at 05/25/2020 07/26/2020 Last data filed at 05/25/2020 07/26/2020 Gross per 24 hour  Intake 0 ml  Output 150 ml  Net -150 ml   Filed Weights   05/12/20 1551 05/13/20 1420  Weight: 55.3 kg 55.3 kg    Examination:  General exam: Sitting in chair, NAD respiratory system: CTA, no wheeze rales rhonchi's Cardiovascular system: RRR S1-S2 Gastrointestinal system: Less distended, soft, nontender hypoactive bowel sounds Central nervous system: Alert and oriented, grossly intact Extremities: No edema Skin: Warm dry Psychiatry:  Mood & affect appropriate in current setting     Data Reviewed: I have personally reviewed following labs and imaging studies  CBC: Recent Labs  Lab 05/19/20 0503 05/20/20 0540 05/23/20 0805  WBC 9.0 8.9 10.5  NEUTROABS 6.8  --   --   HGB 7.7* 7.8* 10.6*  HCT 22.9* 23.0* 31.2*  MCV 88.4 88.8 87.4  PLT  287 313 478*   Basic Metabolic Panel: Recent Labs  Lab 05/19/20 0503 05/19/20 0503 05/20/20 0540 05/21/20 0620 05/22/20 0443 05/23/20 0805 05/25/20 0616  NA 126*   < > 129* 130* 127* 127* 127*  K 3.5  --  3.2* 3.0* 3.3* 4.0  --   CL 95*  --  97* 98 95* 92*  --   CO2 22  --  23 20* 26 27  --   GLUCOSE 98  --  86 77 119* 112*  --   BUN 7*  --  7* 7* <5* <5*  --   CREATININE 0.52  --  0.46 0.53 0.48 0.68  --   CALCIUM 7.6*  --  7.6* 7.9* 7.9* 8.6*  --   MG 2.0  --  2.1  --   --   --   --    < > = values in this interval not displayed.   GFR: Estimated Creatinine Clearance: 53.9 mL/min (by C-G formula based on SCr of 0.68 mg/dL). Liver Function Tests: Recent Labs  Lab 05/19/20 0503  ALBUMIN 2.3*   No results for input(s): LIPASE, AMYLASE in the last 168 hours. No results for input(s): AMMONIA in the last 168 hours. Coagulation Profile: No results for input(s): INR, PROTIME in the last 168 hours. Cardiac Enzymes: No results for input(s): CKTOTAL, CKMB, CKMBINDEX, TROPONINI in the last 168 hours. BNP (last 3 results) No results for input(s): PROBNP  in the last 8760 hours. HbA1C: No results for input(s): HGBA1C in the last 72 hours. CBG: No results for input(s): GLUCAP in the last 168 hours. Lipid Profile: No results for input(s): CHOL, HDL, LDLCALC, TRIG, CHOLHDL, LDLDIRECT in the last 72 hours. Thyroid Function Tests: No results for input(s): TSH, T4TOTAL, FREET4, T3FREE, THYROIDAB in the last 72 hours. Anemia Panel: No results for input(s): VITAMINB12, FOLATE, FERRITIN, TIBC, IRON, RETICCTPCT in the last 72 hours. Sepsis Labs: No results for input(s): PROCALCITON, LATICACIDVEN in the last 168 hours.  Recent Results (from the past 240 hour(s))  SARS CORONAVIRUS 2 (TAT 6-24 HRS) Nasopharyngeal Nasopharyngeal Swab     Status: None   Collection Time: 05/18/20  1:03 PM   Specimen: Nasopharyngeal Swab  Result Value Ref Range Status   SARS Coronavirus 2 NEGATIVE NEGATIVE  Final    Comment: (NOTE) SARS-CoV-2 target nucleic acids are NOT DETECTED.  The SARS-CoV-2 RNA is generally detectable in upper and lower respiratory specimens during the acute phase of infection. Negative results do not preclude SARS-CoV-2 infection, do not rule out co-infections with other pathogens, and should not be used as the sole basis for treatment or other patient management decisions. Negative results must be combined with clinical observations, patient history, and epidemiological information. The expected result is Negative.  Fact Sheet for Patients: HairSlick.no  Fact Sheet for Healthcare Providers: quierodirigir.com  This test is not yet approved or cleared by the [place] FDA and  has been authorized for detection and/or diagnosis of SARS-CoV-2 by FDA under an Emergency Use Authorization (EUA). This EUA will remain  in effect (meaning this test can be used) for the duration of the COVID-19 declaration under Se ction 564(b)(1) of the Act, 21 U.S.C. section 360bbb-3(b)(1), unless the authorization is terminated or revoked sooner.  Performed at [facility], [address] [place], [place] [address]          Radiology Studies: No results found.      Scheduled Meds: . amLODipine  10 mg Oral Daily  . carvedilol  3.125 mg Oral BID  . Chlorhexidine Gluconate Cloth  6 each Topical Daily  . enoxaparin (LOVENOX) injection  40 mg Subcutaneous Q24H  . feeding supplement  1 Container Oral TID BM  . ferrous fumarate-b12-vitamic C-folic acid  1 capsule Oral BID  . levothyroxine  125 mcg Oral Daily  . lisinopril  20 mg Oral Daily  . multivitamin with minerals  1 tablet Oral Daily  . nicotine  21 mg Transdermal Daily  . pantoprazole  40 mg Oral Daily  . polyethylene glycol  17 g Oral Daily  . pravastatin  20 mg Oral Daily  . pregabalin  25 mg Oral Daily  . sodium phosphate  1 enema Rectal BID    Continuous Infusions:   Assessment & Plan:   Principal Problem:   Intertrochanteric fracture of right femur, closed, initial encounter (HCC) Active Problems:   Hypothyroidism   Hypertension   Hyperlipidemia   Chronic back pain   CAD (coronary artery disease)   Tobacco use   Protein-calorie malnutrition, severe   ileus.  Patient has soft distended abdomen.  CT abdomen done  was without any sign of small bowel obstruction or ileus.  Still no BM with good bowel regimen.  Patient developed vomiting with worsening abdominal distention. Repeat KUB with ileus.  Most likely secondary to excessive opioid use.  Patient was on high dose oxycodone and using Flexeril 3-4 times a day. -Discontinue scheduled oxycodone  which is scheduled for 5 times daily, patient has an history of chronic oxycodone use, per son she used to take 3 to 4 tablets/day, currently taking up to 7 tablets daily. -Make Flexeril as needed instead of scheduled. -Continue as needed oxycodone-we will use as needed Dilaudid as patient is n.p.o. at this time. Was given one-time dose of methylnaltrexone for a possible opioid-induced constipation. Gsx following-NGT d/c'd on 7/8 7/10 repeat xr with ileus. Refused NGT to be replaced back 7/12 repeat xray improving Plan : Advance diet to soft diet Aggressive bowel regimen Mobilize gsx signed off.     right hip fracture.  Secondary to mechanical fall.   S/p intramedullary nail placement. PT/OT recommending SNF-patient did had a bed offer but unable to leave due to persistent uncontrolled pain and now ileus. Minimizing pain meds.  Plan: Rehab pending-d/c when ileus resolves F/u ortho as outpt  Chronic hyponatremia.Sodium at her baseline of 129. Na 127 today ? 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 No labs this am ivf was d/c'd  Will continue to monitor   Hypokalemia.  Mag was stable at 2.1 Was supplemented.  Potassium 4.0  Resolved  Anemia secondary  to surgical blood loss.  Hemoglobin 10.6 Continue iron supplement Monitor labs periodically  Hypertension.   Blood pressure control Continue with home carvedilol, amlodipine, lisinopril   Hyperlipidemia: Continue pravastatin  Hypothyroidism: Continue Synthroid  Chronic back pain: Was on oxycodone for couple of years.   Discussed with husband about pain management as outpatient. Will add Lyrica 25mg  qd for her leg spasm/pain    DVT prophylaxis: Lovenox Code Status: Full Family Communication: husband at bedside Disposition Plan: Pending SNF when medically stable Status is: Inpatient  Remains inpatient appropriate because:IV treatments appropriate due to intensity of illness or inability to take PO   Dispo:  Patient From: Home  Planned Disposition: Inpatient Rehab  Expected discharge date: To be determined when ileus resolves and able to take p.o. intake  Medically stable for discharge: No as patient with ileus and unable to tolerate p.o. intake yet           LOS: 13 days   Time spent: 45 min with >50% on coc    , MD [facility] Pager [phone]  If 7PM-7AM, please contact night-coverage [email or web address] Password [facility] 05/25/2020, 8:32 AM

## 2020-05-25 NOTE — Progress Notes (Signed)
Physical Therapy Treatment Patient Details Name: Rebecca Peck MRN: [id number] DOB: [birth year] Today's Date: 05/25/2020    History of Present Illness 74 year old female who is household ambulator with a cane who has had trouble with being unsteady with her gait and suffered a fall and subsequent R hip fracture with ORIF IM nailing 6/30.    PT Comments    Pt ready for session.  Participated in exercises as described below.  She is able to get to EOB with mod a x 1 with encouragement to reach and roll to assist.  Once sitting she leans right with cues to correct balance and requires supervision at all times.  Attempted to stand x 2 with walker and +1 assist.  Despite mod/max a she is unable to come to standing.  Voices frustration.  +2 called for stand pivot to recliner at bedside where she does not fully stand.  Remained in recliner after session.  She does voice wanting to return home ASAP.  Discussed risk/benefits for rehab and encouraged her to take advantage of services offered.   Follow Up Recommendations  SNF;Other (comment)     Equipment Recommendations  3in1 (PT)    Recommendations for Other Services       Precautions / Restrictions Precautions Precautions: Fall Restrictions Weight Bearing Restrictions: Yes RLE Weight Bearing: Weight bearing as tolerated    Mobility  Bed Mobility Overal bed mobility: Needs Assistance Bed Mobility: Supine to Sit Rolling: Mod assist   Supine to sit: Mod assist        Transfers Overall transfer level: Needs assistance Equipment used: Rolling walker (2 wheeled);2 person hand held assist Transfers: Sit to/from [employer] Sit to Stand: Mod assist;Max assist Stand pivot transfers: Mod assist;+2 physical assistance       General transfer comment: attempted x 2 to stand with RW.  unable to complete.  +2 stand pivot tranfer to recliner and never achieved full standing.  Ambulation/Gait             General  Gait Details: unable today.   Stairs             Wheelchair Mobility    Modified Rankin (Stroke Patients Only)       Balance Overall balance assessment: Needs assistance Sitting-balance support: Bilateral upper extremity supported;Feet supported Sitting balance-Leahy Scale: Fair Sitting balance - Comments: right lean with cues to sit fully upright.  close supervision at all times.   Standing balance support: Bilateral upper extremity supported Standing balance-Leahy Scale: Zero Standing balance comment: uanble to fully stand today                            Cognition Arousal/Alertness: Awake/alert Behavior During Therapy: WFL for tasks assessed/performed Overall Cognitive Status: Within Functional Limits for tasks assessed                                        Exercises Total Joint Exercises Ankle Circles/Pumps: AROM;Strengthening;Both;10 reps;Supine Quad Sets: AROM;Strengthening;Both;10 reps;Supine Heel Slides: AAROM;Strengthening;Both;10 reps;Supine Hip ABduction/ADduction: AAROM;Strengthening;Both;10 reps;Supine    General Comments        Pertinent Vitals/Pain Pain Assessment: Faces Faces Pain Scale: Hurts little more Pain Location: R hip and thigh Pain Descriptors / Indicators: Tender;Sore Pain Intervention(s): Limited activity within patient's tolerance;Monitored during session    Home Living  Prior Function            PT Goals (current goals can now be found in the care plan section) Progress towards PT goals: Progressing toward goals    Frequency    BID      PT Plan Current plan remains appropriate    Co-evaluation              AM-PAC PT "6 Clicks" Mobility   Outcome Measure  Help needed turning from your back to your side while in a flat bed without using bedrails?: A Lot Help needed moving from lying on your back to sitting on the side of a flat bed without using  bedrails?: A Lot Help needed moving to and from a bed to a chair (including a wheelchair)?: Total Help needed standing up from a chair using your arms (e.g., wheelchair or bedside chair)?: Total Help needed to walk in hospital room?: Total Help needed climbing 3-5 steps with a railing? : Total 6 Click Score: 8    End of Session Equipment Utilized During Treatment: Gait belt Activity Tolerance: Patient tolerated treatment well Patient left: in chair;with call bell/phone within reach;with chair alarm set Nurse Communication: Mobility status;Precautions;Patient requests pain meds;Weight bearing status;Other (comment)       Time: 9390-3009 PT Time Calculation (min) (ACUTE ONLY): 23 min  Charges:  $Therapeutic Exercise: 8-22 mins $Therapeutic Activity: 8-22 mins                    Danielle Dess, PTA 05/25/20, 10:26 AM

## 2020-05-25 NOTE — Progress Notes (Signed)
Physical Therapy Treatment Patient Details Name: Rebecca Peck MRN: [id number] DOB: 09-09-1946 Today's Date: 05/25/2020    History of Present Illness 74 year old female who is household ambulator with a cane who has had trouble with being unsteady with her gait and suffered a fall and subsequent R hip fracture with ORIF IM nailing 6/30.    PT Comments    Pt in chair x 2 hours this am asking to get back to bed.  She is able to stand to walker with 2 attempts and encouragement and transfer back to bed with +2 assist and RW with mod verbal cues.  During transition she is inc small loose BM and full linen change once supine is performed.  Son in and updated on care.   Follow Up Recommendations  SNF;Other (comment)     Equipment Recommendations  3in1 (PT)    Recommendations for Other Services       Precautions / Restrictions Precautions Precautions: Fall Restrictions Weight Bearing Restrictions: Yes RLE Weight Bearing: Weight bearing as tolerated    Mobility  Bed Mobility Overal bed mobility: Needs Assistance Bed Mobility: Sit to Supine Rolling: Mod assist   Supine to sit: Mod assist Sit to supine: Mod assist;+2 for physical assistance      Transfers Overall transfer level: Needs assistance Equipment used: Rolling walker (2 wheeled) Transfers: Sit to/from Stand Sit to Stand: Mod assist;+2 physical assistance Stand pivot transfers: Mod assist;+2 physical assistance       General transfer comment: attempted x 2 to stand with RW.  unable to complete.  +2 stand pivot tranfer to recliner and never achieved full standing.  Ambulation/Gait Ambulation/Gait assistance: Max assist;Mod assist;+2 physical assistance Gait Distance (Feet): 3 Feet Assistive device: Rolling walker (2 wheeled) Gait Pattern/deviations: Step-to pattern;Antalgic;Trunk flexed Gait velocity: decreased   General Gait Details: unable today.   Stairs             Wheelchair Mobility     Modified Rankin (Stroke Patients Only)       Balance Overall balance assessment: Needs assistance Sitting-balance support: Bilateral upper extremity supported;Feet supported Sitting balance-Leahy Scale: Fair Sitting balance - Comments: right lean with cues to sit fully upright.  close supervision at all times.   Standing balance support: Bilateral upper extremity supported Standing balance-Leahy Scale: Poor Standing balance comment: uanble to fully stand today                            Cognition Arousal/Alertness: Awake/alert Behavior During Therapy: WFL for tasks assessed/performed Overall Cognitive Status: Within Functional Limits for tasks assessed                                        Exercises Total Joint Exercises Ankle Circles/Pumps: AROM;Strengthening;Both;10 reps;Supine Quad Sets: AROM;Strengthening;Both;10 reps;Supine Heel Slides: AAROM;Strengthening;Both;10 reps;Supine Hip ABduction/ADduction: AAROM;Strengthening;Both;10 reps;Supine    General Comments        Pertinent Vitals/Pain Pain Assessment: Faces Faces Pain Scale: Hurts little more Pain Location: R hip and thigh Pain Descriptors / Indicators: Tender;Sore Pain Intervention(s): Limited activity within patient's tolerance;Monitored during session;Repositioned    Home Living                      Prior Function            PT Goals (current goals can now be found in the care  plan section) Progress towards PT goals: Progressing toward goals    Frequency    BID      PT Plan Current plan remains appropriate    Co-evaluation              AM-PAC PT "6 Clicks" Mobility   Outcome Measure  Help needed turning from your back to your side while in a flat bed without using bedrails?: A Lot Help needed moving from lying on your back to sitting on the side of a flat bed without using bedrails?: A Lot Help needed moving to and from a bed to a chair  (including a wheelchair)?: A Lot Help needed standing up from a chair using your arms (e.g., wheelchair or bedside chair)?: A Lot Help needed to walk in hospital room?: A Lot Help needed climbing 3-5 steps with a railing? : Total 6 Click Score: 11    End of Session Equipment Utilized During Treatment: Gait belt Activity Tolerance: Patient tolerated treatment well Patient left: in bed;with call bell/phone within reach;with bed alarm set Nurse Communication: Mobility status;Precautions;Patient requests pain meds;Weight bearing status;Other (comment)       Time: 4403-4742 PT Time Calculation (min) (ACUTE ONLY): 23 min  Charges:  $Gait Training: 8-22 mins $Therapeutic Exercise: 8-22 mins $Therapeutic Activity: 8-22 mins                    Danielle Dess, PTA 05/25/20, 12:49 PM

## 2020-05-26 LAB — CBC
HCT: 28 % — ABNORMAL LOW (ref 36.0–46.0)
Hemoglobin: 9.2 g/dL — ABNORMAL LOW (ref 12.0–15.0)
MCH: 29.3 pg (ref 26.0–34.0)
MCHC: 32.9 g/dL (ref 30.0–36.0)
MCV: 89.2 fL (ref 80.0–100.0)
Platelets: 326 10*3/uL (ref 150–400)
RBC: 3.14 MIL/uL — ABNORMAL LOW (ref 3.87–5.11)
RDW: 15.3 % (ref 11.5–15.5)
WBC: 10.7 10*3/uL — ABNORMAL HIGH (ref 4.0–10.5)
nRBC: 0 % (ref 0.0–0.2)

## 2020-05-26 LAB — BASIC METABOLIC PANEL
Anion gap: 9 (ref 5–15)
BUN: 10 mg/dL (ref 8–23)
CO2: 29 mmol/L (ref 22–32)
Calcium: 8.3 mg/dL — ABNORMAL LOW (ref 8.9–10.3)
Chloride: 90 mmol/L — ABNORMAL LOW (ref 98–111)
Creatinine, Ser: 0.66 mg/dL (ref 0.44–1.00)
GFR calc Af Amer: >60 mL/min (ref >60)
GFR calc non Af Amer: >60 mL/min (ref >60)
Glucose, Bld: 106 mg/dL — ABNORMAL HIGH (ref 70–99)
Potassium: 3.3 mmol/L — ABNORMAL LOW (ref 3.5–5.1)
Sodium: 128 mmol/L — ABNORMAL LOW (ref 135–145)

## 2020-05-26 LAB — MAGNESIUM: Magnesium: 2.1 mg/dL (ref 1.7–2.4)

## 2020-05-26 LAB — SARS CORONAVIRUS 2 BY RT PCR (HOSPITAL ORDER, PERFORMED IN ~~LOC~~ HOSPITAL LAB): SARS Coronavirus 2: NEGATIVE

## 2020-05-26 MED ORDER — POTASSIUM CHLORIDE CRYS ER 20 MEQ PO TBCR
40.0000 meq | EXTENDED_RELEASE_TABLET | Freq: Once | ORAL | Status: AC
  Administered 2020-05-26: 40 meq via ORAL
  Filled 2020-05-26: qty 2

## 2020-05-26 MED ORDER — SIMETHICONE 80 MG PO CHEW
80.0000 mg | CHEWABLE_TABLET | Freq: Four times a day (QID) | ORAL | Status: DC
  Administered 2020-05-26 – 2020-05-27 (×4): 80 mg via ORAL
  Filled 2020-05-26 (×7): qty 1

## 2020-05-26 NOTE — TOC Progression Note (Signed)
Transition of Care [facility]) - Progression Note    Patient Details  Name: Rebecca Peck MRN: [id number] Date of Birth: 1946/07/26  Transition of Care [facility]) CM/SW [place], RN Phone Number: 05/26/2020, 10:08 AM  Clinical Narrative:   Met with the patient and discussed DC plan, she still plans to go to [facility], she is not nauseated today, She is unable to walk well, She understands that if she was to go home it would not be safe and she could fall and that would not be good, Will Monitor for DC after she eats         Expected Discharge Plan and Services                                                 Social Determinants of Health (SDOH) Interventions    Readmission Risk Interventions No flowsheet data found.

## 2020-05-26 NOTE — Care Management Important Message (Signed)
Important Message  Patient Details  Name: Rebecca Peck MRN: [id number] Date of Birth: March 10, 1946   Medicare Important Message Given:  Yes     [name] A Katsumi Wisler 05/26/2020, 9:13 AM

## 2020-05-26 NOTE — Progress Notes (Signed)
PROGRESS NOTE    Rebecca Peck  EGB:[id number] DOB: 1946/04/09 DOA: 05/12/2020 PCP: Patient, No Pcp Per    Brief Narrative:  Rebecca Keel Pickardis a 74 y.o.femalewith medical history significant forCADwith remote stent placement, hypertension, hyperlipidemia, hypothyroidism, GERD,tobacco use,and chronic back pain who was admitted 05/12/2020 with right hip fracture status post mechanical fall s/p ORIF.  Developed anorexia, nausea and vomiting postoperatively, some concern of ileus but CT abdomen was negative.  NG tube was placed     Consultants:   General surgery  Procedures: NG tube placement  Antimicrobials:       Subjective: Tolerated soft diet. No n/v/abd pain. +bm  Objective: Vitals:   05/25/20 2114 05/25/20 2119 05/25/20 2348 05/26/20 0717  BP: 116/66 116/66 132/77 131/63  Pulse: 81 81 80 93  Resp:  14 14 14   Temp:  99 F (37.2 C) 98.6 F (37 C) 98.3 F (36.8 C)  TempSrc:  Oral Oral Oral  SpO2:  99% 97% 96%  Weight:      Height:        Intake/Output Summary (Last 24 hours) at 05/26/2020 1307 Last data filed at 05/26/2020 0535 Gross per 24 hour  Intake 360 ml  Output 500 ml  Net -140 ml   Filed Weights   05/12/20 1551 05/13/20 1420  Weight: 55.3 kg 55.3 kg    Examination:  General exam: lying in bed, nad, comfortable respiratory system: cta no w/r/r Cardiovascular system: Regular S1-S2 no murmurs rubs gallops Gastrointestinal system: Soft mildly distended, nontender, positive bowel sounds but hypoactive no rebound or guarding  Central nervous system: Alert oriented x3 grossly intact Extremities: No edema Skin: Warm dry Psychiatry:  Mood & affect appropriate in current setting     Data Reviewed: I have personally reviewed following labs and imaging studies  CBC: Recent Labs  Lab 05/20/20 0540 05/23/20 0805 05/26/20 0826  WBC 8.9 10.5 10.7*  HGB 7.8* 10.6* 9.2*  HCT 23.0* 31.2* 28.0*  MCV 88.8 87.4 89.2  PLT 313 478* 326    Basic Metabolic Panel: Recent Labs  Lab 05/20/20 0540 05/20/20 0540 05/21/20 0620 05/22/20 0443 05/23/20 0805 05/25/20 0616 05/26/20 0826  NA 129*   < > 130* 127* 127* 127* 128*  K 3.2*  --  3.0* 3.3* 4.0  --  3.3*  CL 97*  --  98 95* 92*  --  90*  CO2 23  --  20* 26 27  --  29  GLUCOSE 86  --  77 119* 112*  --  106*  BUN 7*  --  7* <5* <5*  --  10  CREATININE 0.46  --  0.53 0.48 0.68  --  0.66  CALCIUM 7.6*  --  7.9* 7.9* 8.6*  --  8.3*  MG 2.1  --   --   --   --   --  2.1   < > = values in this interval not displayed.   GFR: Estimated Creatinine Clearance: 53.9 mL/min (by C-G formula based on SCr of 0.66 mg/dL). Liver Function Tests: No results for input(s): AST, ALT, ALKPHOS, BILITOT, PROT, ALBUMIN in the last 168 hours. No results for input(s): LIPASE, AMYLASE in the last 168 hours. No results for input(s): AMMONIA in the last 168 hours. Coagulation Profile: No results for input(s): INR, PROTIME in the last 168 hours. Cardiac Enzymes: No results for input(s): CKTOTAL, CKMB, CKMBINDEX, TROPONINI in the last 168 hours. BNP (last 3 results) No results for input(s): PROBNP in the last 8760  hours. HbA1C: No results for input(s): HGBA1C in the last 72 hours. CBG: No results for input(s): GLUCAP in the last 168 hours. Lipid Profile: No results for input(s): CHOL, HDL, LDLCALC, TRIG, CHOLHDL, LDLDIRECT in the last 72 hours. Thyroid Function Tests: No results for input(s): TSH, T4TOTAL, FREET4, T3FREE, THYROIDAB in the last 72 hours. Anemia Panel: No results for input(s): VITAMINB12, FOLATE, FERRITIN, TIBC, IRON, RETICCTPCT in the last 72 hours. Sepsis Labs: No results for input(s): PROCALCITON, LATICACIDVEN in the last 168 hours.  Recent Results (from the past 240 hour(s))  SARS CORONAVIRUS 2 (TAT 6-24 HRS) Nasopharyngeal Nasopharyngeal Swab     Status: None   Collection Time: 05/18/20  1:03 PM   Specimen: Nasopharyngeal Swab  Result Value Ref Range Status   SARS  Coronavirus 2 NEGATIVE NEGATIVE Final    Comment: (NOTE) SARS-CoV-2 target nucleic acids are NOT DETECTED.  The SARS-CoV-2 RNA is generally detectable in upper and lower respiratory specimens during the acute phase of infection. Negative results do not preclude SARS-CoV-2 infection, do not rule out co-infections with other pathogens, and should not be used as the sole basis for treatment or other patient management decisions. Negative results must be combined with clinical observations, patient history, and epidemiological information. The expected result is Negative.  Fact Sheet for Patients: HairSlick.no  Fact Sheet for Healthcare Providers: quierodirigir.com  This test is not yet approved or cleared by the [place] FDA and  has been authorized for detection and/or diagnosis of SARS-CoV-2 by FDA under an Emergency Use Authorization (EUA). This EUA will remain  in effect (meaning this test can be used) for the duration of the COVID-19 declaration under Se ction 564(b)(1) of the Act, 21 U.S.C. section 360bbb-3(b)(1), unless the authorization is terminated or revoked sooner.  Performed at [facility], [address], [place], [place] [address]          Radiology Studies: DG Abd 1 View  Result Date: 05/25/2020 CLINICAL DATA:  Ileus EXAM: ABDOMEN - 1 VIEW COMPARISON:  May 23, 2020 abdominal radiograph and CT abdomen and pelvis May 17, 2020 FINDINGS: There are loops of mildly dilated bowel without air-fluid levels, similar to recent study. No appreciable free air. Calcified abdominal aortic aneurysm appears grossly stable compared to recent CT examination. Postoperative change noted in the proximal right femur. Lung bases are clear. IMPRESSION: Mild generalized bowel dilatation without air-fluid levels. Suspect ileus as most likely etiology. No free air. Abdominal aortic aneurysm with calcification, better delineated  on recent CT and grossly stable. Lung bases clear. Electronically Signed   By: Bretta Bang III M.D.   On: 05/25/2020 09:06        Scheduled Meds: . amLODipine  10 mg Oral Daily  . carvedilol  3.125 mg Oral BID  . Chlorhexidine Gluconate Cloth  6 each Topical Daily  . enoxaparin (LOVENOX) injection  40 mg Subcutaneous Q24H  . feeding supplement  1 Container Oral TID BM  . ferrous fumarate-b12-vitamic C-folic acid  1 capsule Oral BID  . levothyroxine  125 mcg Oral Daily  . lisinopril  20 mg Oral Daily  . multivitamin with minerals  1 tablet Oral Daily  . nicotine  21 mg Transdermal Daily  . pantoprazole  40 mg Oral Daily  . polyethylene glycol  17 g Oral Daily  . pravastatin  20 mg Oral Daily  . pregabalin  25 mg Oral Daily  . sodium phosphate  1 enema Rectal BID   Continuous Infusions:  Assessment & Plan:   Principal Problem:   Intertrochanteric fracture of right femur, closed, initial encounter (HCC) Active Problems:   Hypothyroidism   Hypertension   Hyperlipidemia   Chronic back pain   CAD (coronary artery disease)   Tobacco use   Protein-calorie malnutrition, severe   ileus.  Patient has soft distended abdomen.  CT abdomen done  was without any sign of small bowel obstruction or ileus.  Still no BM with good bowel regimen.  Patient developed vomiting with worsening abdominal distention. Repeat KUB with ileus.  Most likely secondary to excessive opioid use.  Patient was on high dose oxycodone and using Flexeril 3-4 times a day. -Discontinue scheduled oxycodone which is scheduled for 5 times daily, patient has an history of chronic oxycodone use, per son she used to take 3 to 4 tablets/day, currently taking up to 7 tablets daily. -Make Flexeril as needed instead of scheduled. -Continue as needed oxycodone-we will use as needed Dilaudid as patient is n.p.o. at this time. Was given one-time dose of methylnaltrexone for a possible opioid-induced  constipation General surgery signed off. Gsx following-NGT d/c'd on 7/8 7/10 repeat xr with ileus. Refused NGT to be replaced back 7/12 repeat xray improving Plan: Tolerating soft diet.  Will increase to regular diet and monitor overnight to see if she has any issues Aggressive bowel regimen Mobilization     right hip fracture.  Secondary to mechanical fall.   S/p intramedullary nail placement. PT/OT recommending SNF-patient did had a bed offer but unable to leave due to persistent uncontrolled pain and now ileus. Minimizing pain meds.  Plan: Rehab pending-d/c when ileus resolves Ortho saw patient today-Staples removed and Steri-Strips applied  Plan: Patient will follow-up with [facility] orth[facility] in 4 weeks for x-rays of the right femur.  Patient can begin showering and allow Steri-Strips to fall off on their own.    Chronic hyponatremia.Sodium at her baseline of 129. Sodium 128 today ? 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 No labs this am ivf was d/c'd  Continue to monitor levels periodically   Hypokalemia.  Mag was stable at 2.1 Potassium today 3.3, will supplement with KCl 40 mEq p.o. x1 Monitor periodically    Anemia secondary to surgical blood loss.  Hemoglobin 10.6 Continue iron supplement Monitor labs periodically  Hypertension.   Blood pressure control Continue with home carvedilol, amlodipine, lisinopril   Hyperlipidemia: Continue pravastatin  Hypothyroidism: Continue Synthroid  Chronic back pain: Was on oxycodone for couple of years.   Discussed with husband about pain management as outpatient. Added Lyrica for her leg spasm      DVT prophylaxis: Lovenox Code Status: Full Family Communication: husband at bedside Disposition Plan: Pending SNF when medically stable Status is: Inpatient  Remains inpatient appropriate because:IV treatments appropriate due to intensity of illness or inability to take PO   Dispo:  Patient From:  Home  Planned Disposition: Inpatient Rehab  Expected discharge date: in 1-2 days  Medically stable for discharge: No as patient with ileus , needs to tolerate solids and make sure not having n/v/abd pain again , then to rehab  In am if tolerates feeding          LOS: 14 days   Time spent: 45 min with >50% on coc    Lynn Ito, MD [facility] Pager [phone]  If 7PM-7AM, please contact night-coverage [email or web address] Password [facility] 05/26/2020, 1:07 PM

## 2020-05-26 NOTE — Progress Notes (Signed)
PT Cancellation Note  Patient Details Name: Rebecca Peck MRN: [id number] DOB: 02-24-1946   Cancelled Treatment:    Reason Eval/Treat Not Completed: Other (comment)   Pt returning to bed with nursing upon arrival.  Nursing reports difficulty +2 transfer back to bed with poor ability to stand.  Pt had just received enema and declined further interventions at this time.   Danielle Dess 05/26/2020, 1:05 PM

## 2020-05-26 NOTE — Progress Notes (Signed)
Physical Therapy Treatment Patient Details Name: Rebecca Peck MRN: [id number] DOB: 10-Nov-1946 Today's Date: 05/26/2020    History of Present Illness 74 year old female who is household ambulator with a cane who has had trouble with being unsteady with her gait and suffered a fall and subsequent R hip fracture with ORIF IM nailing 6/30.    PT Comments    Similar to this PT's sessions with her just post surgery nearly 2 weeks ago she remains able to do better with supine exercises and show some ability to do AROM that does not translate to functional mobility.  She very much struggled with all aspects of mobility, and especially with attempts at standing.  Even with heavy cuing and assist pre and during activity she was unable to attain fully upright/get hips forward.  Overall pt continues to be very functionally limited.  Despite c/o 10/10 pain she was clearly better able to tolerate A/PROM activities today than with this PT post surgery.  Follow Up Recommendations  SNF     Equipment Recommendations   (TDB at next venue of care)    Recommendations for Other Services       Precautions / Restrictions Precautions Precautions: Fall Restrictions Weight Bearing Restrictions: Yes RLE Weight Bearing: Weight bearing as tolerated    Mobility  Bed Mobility Overal bed mobility: Needs Assistance Bed Mobility: Sit to Supine Rolling: Mod assist   Supine to sit: Mod assist;Max assist     General bed mobility comments: Pt needed heavy assist to elevate trunk and get LEs to EOB.  Less, but still considerable assist to scoot forward to get feet on floor  Transfers Overall transfer level: Needs assistance Equipment used: Rolling walker (2 wheeled) Transfers: Sit to/from Stand Sit to Stand: Max assist Stand pivot transfers: Total assist       General transfer comment: Pt did poorly with transition to standing and even with heavy cuing (verbal and physical) to keep hips forward, use  walker and to try to tolerate some WBing on R she did poorly.  With very heavy unweighting assist she managed a small, unconfident step with R foot and then well before getting square to chair (and despite much cuing to not sit yet) she started sitting back and needed heavy assist to insure she landed on the recliner.  Ambulation/Gait             General Gait Details: unsafe/unable    Stairs             Wheelchair Mobility    Modified Rankin (Stroke Patients Only)       Balance Overall balance assessment: Needs assistance Sitting-balance support: Bilateral upper extremity supported;Feet supported Sitting balance-Leahy Scale: Fair Sitting balance - Comments: Pt again with self-selected R lean, able to straighten up with cues     Standing balance-Leahy Scale: Poor Standing balance comment: Pt continues to struggle to even get hips up and forward enough to really get fully upright                            Cognition Arousal/Alertness: Awake/alert Behavior During Therapy: WFL for tasks assessed/performed Overall Cognitive Status: Within Functional Limits for tasks assessed                                 General Comments: Pt agreeable to PT session. does require encouragement for full participation  Exercises General Exercises - Lower Extremity Ankle Circles/Pumps: Strengthening;10 reps Quad Sets: AROM;Strengthening;10 reps Short Arc Quad: AAROM;AROM;10 reps Heel Slides: AROM;Right;10 reps (with resisted leg extensions) Hip ABduction/ADduction: AROM;Strengthening;10 reps Hip Flexion/Marching: AROM;Strengthening;10 reps    General Comments        Pertinent Vitals/Pain Pain Assessment: 0-10 Pain Score: 10-Worst pain ever (Also mentions that it is better now than it was earlier...)    Home Living                      Prior Function            PT Goals (current goals can now be found in the care plan section)  Progress towards PT goals: Progressing toward goals    Frequency    BID      PT Plan Current plan remains appropriate    Co-evaluation              AM-PAC PT "6 Clicks" Mobility   Outcome Measure  Help needed turning from your back to your side while in a flat bed without using bedrails?: A Lot Help needed moving from lying on your back to sitting on the side of a flat bed without using bedrails?: Total Help needed moving to and from a bed to a chair (including a wheelchair)?: Total Help needed standing up from a chair using your arms (e.g., wheelchair or bedside chair)?: Total Help needed to walk in hospital room?: Total Help needed climbing 3-5 steps with a railing? : Total 6 Click Score: 7    End of Session Equipment Utilized During Treatment: Gait belt Activity Tolerance: Patient limited by pain Patient left: with chair alarm set;with call bell/phone within reach Nurse Communication: Mobility status PT Visit Diagnosis: Muscle weakness (generalized) (M62.81);Difficulty in walking, not elsewhere classified (R26.2)     Time: 2620-3559 PT Time Calculation (min) (ACUTE ONLY): 25 min  Charges:  $Therapeutic Exercise: 8-22 mins $Therapeutic Activity: 8-22 mins                     Malachi Pro, DPT 05/26/2020, 1:01 PM

## 2020-05-26 NOTE — TOC Progression Note (Signed)
Transition of Care [facility]) - Progression Note    Patient Details  Name: Rebecca Peck MRN: [id number] Date of Birth: 10/20/1946  Transition of [facility]) CM/SW Contact  Barrie Dunker, RN Phone Number: 05/26/2020, 2:03 PM  Clinical Narrative:    Sheila Oats with [facility] about the patient's status and auth approval, she stated it would be better for her if the patient DC tomorrow, the patient is to tolerate a regular diet then to DC        Expected Discharge Plan and Services                                                 Social Determinants of Health (SDOH) Interventions    Readmission Risk Interventions No flowsheet data found.

## 2020-05-26 NOTE — Progress Notes (Signed)
   Subjective: 13 Days Post-Op Procedure(s) (LRB): INTRAMEDULLARY (IM) NAIL FEMORAL (Right) Patient reports pain as mild.   Patient is well, and has had no acute complaints or problems Denies any CP, SOB, ABD pain.  Currently without nausea or vomiting. Poor progress with physical therapy.  PT requesting SNF  Objective: Vital signs in last 24 hours: Temp:  [98.3 F (36.8 C)-99 F (37.2 C)] 98.3 F (36.8 C) (07/13 0717) Pulse Rate:  [80-93] 93 (07/13 0717) Resp:  [14] 14 (07/13 0717) BP: (116-142)/(63-77) 131/63 (07/13 0717) SpO2:  [95 %-99 %] 96 % (07/13 0717)  Intake/Output from previous day: 07/12 0701 - 07/13 0700 In: 720 [P.O.:720] Out: 500 [Urine:500] Intake/Output this shift: No intake/output data recorded.  Recent Labs    05/26/20 0826  HGB 9.2*   Recent Labs    05/26/20 0826  WBC 10.7*  RBC 3.14*  HCT 28.0*  PLT 326   Recent Labs    05/25/20 0616 05/26/20 0826  NA 127* 128*  K  --  3.3*  CL  --  90*  CO2  --  29  BUN  --  10  CREATININE  --  0.66  GLUCOSE  --  106*  CALCIUM  --  8.3*   No results for input(s): LABPT, INR in the last 72 hours.  EXAM General - Patient is Alert, Appropriate and Oriented Extremity - Neurovascular intact Sensation intact distally Intact pulses distally Dorsiflexion/Plantar flexion intact No cellulitis present Compartment soft Dressing - dressing C/D/I and no drainage, staples removed and Steri-Strips applied.  No signs of infection. Motor Function - intact, moving foot and toes well on exam.   Past Medical History:  Diagnosis Date  . CAD (coronary artery disease)   . Chronic back pain   . Hyperlipidemia   . Hypertension   . Hypothyroidism   . Tobacco use     Assessment/Plan:   13 Days Post-Op Procedure(s) (LRB): INTRAMEDULLARY (IM) NAIL FEMORAL (Right) Principal Problem:   Intertrochanteric fracture of right femur, closed, initial encounter (HCC) Active Problems:   Hypothyroidism   Hypertension    Hyperlipidemia   Chronic back pain   CAD (coronary artery disease)   Tobacco use   Protein-calorie malnutrition, severe  Estimated body mass index is 19.69 kg/m as calculated from the following:   Height as of this encounter: 5\' 6"  (1.676 m).   Weight as of this encounter: 55.3 kg.  Pain well controlled right lower extremity.  Staples removed and Steri-Strips applied today.  Thigh and lower extremity is soft with no signs of cellulitis.  Incision sites appear healed and well.  Patient will follow-up with [facility] orthopedics in 4 weeks for x-rays of the right femur.  Patient can begin showering and allow Steri-Strips to fall off on their own.   DVT Prophylaxis - Lovenox, TED hose and SCDs Weight-Bearing as tolerated to right leg   [facility], PA-C [facility] 05/26/2020, 11:08 AM

## 2020-05-27 DIAGNOSIS — E871 Hypo-osmolality and hyponatremia: Secondary | ICD-10-CM

## 2020-05-27 DIAGNOSIS — G8929 Other chronic pain: Secondary | ICD-10-CM

## 2020-05-27 DIAGNOSIS — K567 Ileus, unspecified: Secondary | ICD-10-CM

## 2020-05-27 DIAGNOSIS — M545 Low back pain: Secondary | ICD-10-CM

## 2020-05-27 LAB — BASIC METABOLIC PANEL
Anion gap: 12 (ref 5–15)
BUN: 10 mg/dL (ref 8–23)
CO2: 26 mmol/L (ref 22–32)
Calcium: 8.2 mg/dL — ABNORMAL LOW (ref 8.9–10.3)
Chloride: 89 mmol/L — ABNORMAL LOW (ref 98–111)
Creatinine, Ser: 0.57 mg/dL (ref 0.44–1.00)
GFR calc Af Amer: >60 mL/min (ref >60)
GFR calc non Af Amer: >60 mL/min (ref >60)
Glucose, Bld: 100 mg/dL — ABNORMAL HIGH (ref 70–99)
Potassium: 3.4 mmol/L — ABNORMAL LOW (ref 3.5–5.1)
Sodium: 127 mmol/L — ABNORMAL LOW (ref 135–145)

## 2020-05-27 LAB — CREATININE, SERUM
Creatinine, Ser: 0.62 mg/dL (ref 0.44–1.00)
GFR calc Af Amer: >60 mL/min (ref >60)
GFR calc non Af Amer: >60 mL/min (ref >60)

## 2020-05-27 LAB — MAGNESIUM: Magnesium: 2 mg/dL (ref 1.7–2.4)

## 2020-05-27 MED ORDER — POLYETHYLENE GLYCOL 3350 17 G PO PACK: 17.0000 g | PACK | Freq: Every day | ORAL | 0 refills | Status: AC | PRN

## 2020-05-27 MED ORDER — SENNOSIDES-DOCUSATE SODIUM 8.6-50 MG PO TABS: 2.0000 | ORAL_TABLET | Freq: Two times a day (BID) | ORAL | 0 refills | Status: AC

## 2020-05-27 MED ORDER — OXYCODONE HCL 10 MG PO TABS: 10.0000 mg | ORAL_TABLET | Freq: Every day | ORAL | 0 refills | Status: AC | PRN

## 2020-05-27 MED ORDER — SODIUM CHLORIDE 1 G PO TABS: 1.0000 g | ORAL_TABLET | Freq: Every day | ORAL | 0 refills | Status: AC

## 2020-05-27 MED ORDER — PREGABALIN 25 MG PO CAPS: 25.0000 mg | ORAL_CAPSULE | Freq: Every day | ORAL | 0 refills | Status: AC

## 2020-05-27 MED ORDER — SENNOSIDES-DOCUSATE SODIUM 8.6-50 MG PO TABS: 2.0000 | ORAL_TABLET | Freq: Two times a day (BID) | ORAL | Status: DC

## 2020-05-27 MED ORDER — POTASSIUM CHLORIDE 20 MEQ PO PACK
40.0000 meq | PACK | ORAL | Status: AC
  Administered 2020-05-27 (×2): 40 meq via ORAL
  Filled 2020-05-27 (×2): qty 2

## 2020-05-27 MED ORDER — BISACODYL 10 MG RE SUPP: 10.0000 mg | Freq: Every day | RECTAL | 0 refills | Status: AC | PRN

## 2020-05-27 MED ORDER — FE FUMARATE-B12-VIT C-FA-IFC PO CAPS: 1.0000 | ORAL_CAPSULE | Freq: Two times a day (BID) | ORAL | 0 refills | Status: AC

## 2020-05-27 MED ORDER — POTASSIUM CHLORIDE 20 MEQ PO PACK: 40.0000 meq | PACK | Freq: Once | ORAL | Status: DC

## 2020-05-27 NOTE — Discharge Summary (Signed)
Physician Discharge Summary  Patient ID: Rebecca Peck MRN: [id number] DOB/AGE: [birth year] 75 y.o.  Admit date: 05/12/2020 Discharge date: 05/27/2020   Discharge instructions #1.  Follow-up with PCP in nursing home in 1 week. 2.  Follow-up orthopedics as scheduled. 3.  Restriction less than 1500 mL/day. 4.  Check a BMP in 4 to 5 days. 5.  May him enema if constipation.    Admission Diagnoses:  Discharge Diagnoses:  Principal Problem:   Intertrochanteric fracture of right femur, closed, initial encounter (HCC) Active Problems:   Hypothyroidism   Hypertension   Hyperlipidemia   Chronic back pain   CAD (coronary artery disease)   Tobacco use   Protein-calorie malnutrition, severe   Discharged Condition: good  Hospital Course:  Rebecca Loch Pickardis a 74 y.o.femalewith medical history significant forCADwith remote stent placement, hypertension, hyperlipidemia, hypothyroidism, GERD,tobacco use,and chronic back pain who was admitted 05/12/2020 with right hip fracture status post mechanical fall s/p ORIF. Developed anorexia, nausea and vomiting postoperatively, some concern of ileus but CT abdomen was negative.  NG tube was placed.  Patient was also given stool softener and enema, she has been having bowel movements for the last 2 days. Her abdominal distention has been improved. At this point, she is tolerating a solid diet without nausea vomiting. She is medically stable to be discharged.  #1. Right hip fracture. Status post right hip fracture. Status post open reduction and fixation.  Condition had improved.  We will continue Lovenox for 2 weeks with prophylaxis of DVT.  Patient be followed by orthopedics as scheduled.  2.  Ileus. Patient developed significant constipation with ileus, this is secondary to chronic narcotic use as well as immobility from surgery.  She has been given stool softeners, she also giving enema.  Her condition has been improving for the last 2  days.  She had multiple bowel movements.  She is tolerating solid diet.  At this time, her condition had improved.  3.  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  Patient will be placed on fluid restriction at 1500 mL/day.  She does not have any volume overload, I will also start on sodium chloride 1 g daily    She will need check a BMP in a few days in the nursing home.  4.  Chronic pain disorder. Patient was taking oxycodone 10 mg 5 times a day as needed for many years.  After discussion with the patient son and patient, will continue this regimen.  I will discontinue muscle relaxer.  I will continue some Lyrica for leg spasm which is started in the hospital.  Consults: orthopedic surgery  Significant Diagnostic Studies:   Treatments: Hip ORIF  Discharge Exam: Blood pressure 140/72, pulse 76, temperature 98.4 F (36.9 C), temperature source Oral, resp. rate 17, height 5\' 6"  (1.676 m), weight 55.3 kg, SpO2 98 %. General appearance: alert and cooperative Resp: clear to auscultation bilaterally Cardio: regular rate and rhythm, S1, S2 normal, no murmur, click, rub or gallop GI: soft, non-tender; bowel sounds normal; no masses,  no organomegaly Extremities: extremities normal, atraumatic, no cyanosis or edema  Disposition: Discharge disposition: 03-Skilled Nursing Facility       Discharge Instructions    Diet - low sodium heart healthy   Complete by: As directed    Discharge wound care:   Complete by: As directed    Dressing change daily, follow with orthopedics as scheduled.   Increase activity slowly   Complete by: As directed  Allergies as of 05/27/2020      Reactions   Sulfa Antibiotics Rash      Medication List    STOP taking these medications   cyclobenzaprine 5 MG tablet Commonly known as: FLEXERIL     TAKE these medications   amLODipine 5 MG tablet Commonly known as: NORVASC Take 10 mg by mouth daily.   bisacodyl 10 MG  suppository Commonly known as: DULCOLAX Place 1 suppository (10 mg total) rectally daily as needed for moderate constipation.   carvedilol 3.125 MG tablet Commonly known as: COREG Take 3.125 mg by mouth 2 (two) times daily.   enoxaparin 40 MG/0.4ML injection Commonly known as: LOVENOX Inject 0.4 mLs (40 mg total) into the skin daily for 14 days.   ferrous fumarate-b12-vitamic C-folic acid capsule Commonly known as: TRINSICON / FOLTRIN Take 1 capsule by mouth 2 (two) times daily.   fluticasone 50 MCG/ACT nasal spray Commonly known as: FLONASE Place 1 spray into both nostrils daily as needed for allergies.   levothyroxine 125 MCG tablet Commonly known as: SYNTHROID Take 125 mcg by mouth daily.   lisinopril 20 MG tablet Commonly known as: ZESTRIL Take 20 mg by mouth daily.   Oxycodone HCl 10 MG Tabs Take 1 tablet (10 mg total) by mouth 5 (five) times daily as needed. What changed:   when to take this  reasons to take this   pantoprazole 40 MG tablet Commonly known as: PROTONIX Take 40 mg by mouth daily.   polyethylene glycol 17 g packet Commonly known as: MIRALAX / GLYCOLAX Take 17 g by mouth daily as needed for mild constipation or moderate constipation.   pravastatin 20 MG tablet Commonly known as: PRAVACHOL Take 20 mg by mouth daily.   pregabalin 25 MG capsule Commonly known as: LYRICA Take 1 capsule (25 mg total) by mouth daily. Start taking on: May 28, 2020   promethazine 25 MG tablet Commonly known as: PHENERGAN Take 25 mg by mouth every 8 (eight) hours as needed for nausea or vomiting.   senna-docusate 8.6-50 MG tablet Commonly known as: Senokot-S Take 2 tablets by mouth 2 (two) times daily.   sodium chloride 1 g tablet Take 1 tablet (1 g total) by mouth daily.            Discharge Care Instructions  (From admission, onward)         Start     Ordered   05/27/20 0000  Discharge wound care:       Comments: Dressing change daily, follow  with orthopedics as scheduled.   05/27/20 1022          Contact information for follow-up providers    Kennedy Bucker, MD Follow up in 4 week(s).   Specialty: Orthopedic Surgery Contact information: [address] [facility] [place] [address] [phone]            Contact information for after-discharge care    Destination    [facility] .   Service: Skilled Nursing Contact information: [address] [place] [address] [phone]                 36 minutes Signed: Marrion Coy 05/27/2020, 10:28 AM

## 2020-05-27 NOTE — Progress Notes (Signed)
Physical Therapy Treatment Patient Details Name: Rebecca Peck MRN: [id number] DOB: 1946/09/13 Today's Date: 05/27/2020    History of Present Illness 74 year old female who is household ambulator with a cane who has had trouble with being unsteady with her gait and suffered a fall and subsequent R hip fracture with ORIF IM nailing 6/30.    PT Comments    Pt resting in bed upon PT arrival; pt agreeable to physical therapy with a little encouragement.  Tolerated LE ex's in bed fairly well.  Bowel incontinence noted prior to getting OOB so therapist called NT.  Deferred OOB mobility d/t pt with continued bowel incontinence in bed (requiring clean-up 3x's)--pt reporting getting enema this morning; CGA to min assist with logrolling in bed.  Plan to discharge to STR today.    Follow Up Recommendations  SNF     Equipment Recommendations   (TBD at next venue of care)    Recommendations for Other Services OT consult     Precautions / Restrictions Precautions Precautions: Fall Restrictions Weight Bearing Restrictions: Yes RLE Weight Bearing: Weight bearing as tolerated    Mobility  Bed Mobility Overal bed mobility: Needs Assistance Bed Mobility: Rolling Rolling: Min guard;Min assist (logrolling L/R in bed for clean-up 3x's)         General bed mobility comments: Deferred OOB mobility d/t ongoing bowel incontinence  Transfers                    Ambulation/Gait                 Stairs             Wheelchair Mobility    Modified Rankin (Stroke Patients Only)       Balance                                            Cognition Arousal/Alertness: Awake/alert Behavior During Therapy: WFL for tasks assessed/performed Overall Cognitive Status: Within Functional Limits for tasks assessed                                        Exercises Total Joint Exercises Ankle Circles/Pumps: AROM;Strengthening;Both;10  reps;Supine Quad Sets: AROM;Strengthening;Both;10 reps;Supine Heel Slides: AAROM;Strengthening;Both;10 reps;Supine Hip ABduction/ADduction: AAROM;Strengthening;Both;10 reps;Supine    General Comments   Nursing cleared pt for participation in physical therapy.  Pt agreeable to PT session.      Pertinent Vitals/Pain Pain Assessment: Faces Faces Pain Scale: Hurts little more Pain Location: R hip and thigh Pain Descriptors / Indicators: Tender;Sore Pain Intervention(s): Limited activity within patient's tolerance;Monitored during session;Repositioned    Home Living                      Prior Function            PT Goals (current goals can now be found in the care plan section) Acute Rehab PT Goals Patient Stated Goal: get back home  PT Goal Formulation: With patient Time For Goal Achievement: 05/28/20 Potential to Achieve Goals: Fair Progress towards PT goals: Progressing toward goals    Frequency    BID      PT Plan Current plan remains appropriate    Co-evaluation  AM-PAC PT "6 Clicks" Mobility   Outcome Measure  Help needed turning from your back to your side while in a flat bed without using bedrails?: A Little Help needed moving from lying on your back to sitting on the side of a flat bed without using bedrails?: A Lot Help needed moving to and from a bed to a chair (including a wheelchair)?: Total Help needed standing up from a chair using your arms (e.g., wheelchair or bedside chair)?: Total Help needed to walk in hospital room?: Total Help needed climbing 3-5 steps with a railing? : Total 6 Click Score: 9    End of Session Equipment Utilized During Treatment: Gait belt Activity Tolerance: Patient limited by pain Patient left: in bed;with call bell/phone within reach;with bed alarm set;with nursing/sitter in room;with family/visitor present;Other (comment) (B heels floating via pillow support) Nurse Communication: Mobility  status PT Visit Diagnosis: Muscle weakness (generalized) (M62.81);Difficulty in walking, not elsewhere classified (R26.2)     Time: 1030-1055 PT Time Calculation (min) (ACUTE ONLY): 25 min  Charges:  $Therapeutic Exercise: 8-22 mins $Therapeutic Activity: 8-22 mins                    Hendricks Limes, PT 05/27/20, 1:18 PM

## 2020-05-27 NOTE — Progress Notes (Signed)
Pt left the floor via stretcher with EMS. Called report to [facility] with [place], nurse.

## 2020-05-27 NOTE — TOC Progression Note (Signed)
Transition of Care [facility]) - Progression Note    Patient Details  Name: Rebecca Peck MRN: [id number] Date of Birth: 03-01-1946  Transition of Care [facility]) CM/SW Contact  Barrie Dunker, RN Phone Number: 05/27/2020, 10:52 AM  Clinical Narrative:    I called Leavy Cella the son and let him know that the patient will go to [facility] today, He stated understanding, The DC packet is on the chart, the Bedside nurse to call report, the patient will go to room 504        Expected Discharge Plan and Services           Expected Discharge Date: 05/27/20                                     Social Determinants of Health (SDOH) Interventions    Readmission Risk Interventions No flowsheet data found.

## 2020-08-12 ENCOUNTER — Emergency Department: Payer: Medicare HMO

## 2020-08-12 ENCOUNTER — Other Ambulatory Visit: Payer: Self-pay

## 2020-08-12 ENCOUNTER — Inpatient Hospital Stay
Admission: EM | Admit: 2020-08-12 | Discharge: 2020-08-12 | DRG: 300 | Disposition: A | Discharge status: Other Acute Inpatient Hospital | Payer: Medicare HMO | Attending: Internal Medicine | Admitting: Internal Medicine

## 2020-08-12 ENCOUNTER — Encounter: Payer: Self-pay | Admitting: Emergency Medicine

## 2020-08-12 DIAGNOSIS — Z8249 Family history of ischemic heart disease and other diseases of the circulatory system: Secondary | ICD-10-CM

## 2020-08-12 DIAGNOSIS — I4891 Unspecified atrial fibrillation: Secondary | ICD-10-CM | POA: Diagnosis present

## 2020-08-12 DIAGNOSIS — Z882 Allergy status to sulfonamides status: Secondary | ICD-10-CM | POA: Diagnosis not present

## 2020-08-12 DIAGNOSIS — I714 Abdominal aortic aneurysm, without rupture, unspecified: Secondary | ICD-10-CM

## 2020-08-12 DIAGNOSIS — I10 Essential (primary) hypertension: Secondary | ICD-10-CM | POA: Diagnosis present

## 2020-08-12 DIAGNOSIS — E871 Hypo-osmolality and hyponatremia: Secondary | ICD-10-CM | POA: Diagnosis present

## 2020-08-12 DIAGNOSIS — E039 Hypothyroidism, unspecified: Secondary | ICD-10-CM | POA: Diagnosis present

## 2020-08-12 DIAGNOSIS — R112 Nausea with vomiting, unspecified: Secondary | ICD-10-CM | POA: Diagnosis present

## 2020-08-12 DIAGNOSIS — K5900 Constipation, unspecified: Secondary | ICD-10-CM | POA: Diagnosis present

## 2020-08-12 DIAGNOSIS — I7410 Embolism and thrombosis of unspecified parts of aorta: Principal | ICD-10-CM | POA: Diagnosis present

## 2020-08-12 DIAGNOSIS — Z79890 Hormone replacement therapy (postmenopausal): Secondary | ICD-10-CM | POA: Diagnosis not present

## 2020-08-12 DIAGNOSIS — F17210 Nicotine dependence, cigarettes, uncomplicated: Secondary | ICD-10-CM | POA: Diagnosis present

## 2020-08-12 DIAGNOSIS — I2510 Atherosclerotic heart disease of native coronary artery without angina pectoris: Secondary | ICD-10-CM | POA: Diagnosis present

## 2020-08-12 DIAGNOSIS — Z79899 Other long term (current) drug therapy: Secondary | ICD-10-CM | POA: Diagnosis not present

## 2020-08-12 DIAGNOSIS — G8929 Other chronic pain: Secondary | ICD-10-CM | POA: Diagnosis present

## 2020-08-12 DIAGNOSIS — R1033 Periumbilical pain: Secondary | ICD-10-CM | POA: Diagnosis present

## 2020-08-12 DIAGNOSIS — E785 Hyperlipidemia, unspecified: Secondary | ICD-10-CM | POA: Diagnosis present

## 2020-08-12 DIAGNOSIS — R1011 Right upper quadrant pain: Secondary | ICD-10-CM | POA: Diagnosis present

## 2020-08-12 DIAGNOSIS — Z720 Tobacco use: Secondary | ICD-10-CM | POA: Diagnosis present

## 2020-08-12 DIAGNOSIS — M545 Low back pain: Secondary | ICD-10-CM | POA: Diagnosis present

## 2020-08-12 DIAGNOSIS — Z20822 Contact with and (suspected) exposure to covid-19: Secondary | ICD-10-CM | POA: Diagnosis present

## 2020-08-12 DIAGNOSIS — N390 Urinary tract infection, site not specified: Secondary | ICD-10-CM | POA: Diagnosis present

## 2020-08-12 DIAGNOSIS — R1084 Generalized abdominal pain: Secondary | ICD-10-CM

## 2020-08-12 LAB — CBC
HCT: 43.3 % (ref 36.0–46.0)
Hemoglobin: 14.5 g/dL (ref 12.0–15.0)
MCH: 28.7 pg (ref 26.0–34.0)
MCHC: 33.5 g/dL (ref 30.0–36.0)
MCV: 85.6 fL (ref 80.0–100.0)
Platelets: 411 10*3/uL — ABNORMAL HIGH (ref 150–400)
RBC: 5.06 MIL/uL (ref 3.87–5.11)
RDW: 15.6 % — ABNORMAL HIGH (ref 11.5–15.5)
WBC: 14.1 10*3/uL — ABNORMAL HIGH (ref 4.0–10.5)
nRBC: 0 % (ref 0.0–0.2)

## 2020-08-12 LAB — COMPREHENSIVE METABOLIC PANEL
ALT: 17 U/L (ref 0–44)
AST: 23 U/L (ref 15–41)
Albumin: 3.5 g/dL (ref 3.5–5.0)
Alkaline Phosphatase: 115 U/L (ref 38–126)
Anion gap: 11 (ref 5–15)
BUN: 8 mg/dL (ref 8–23)
CO2: 29 mmol/L (ref 22–32)
Calcium: 9.1 mg/dL (ref 8.9–10.3)
Chloride: 89 mmol/L — ABNORMAL LOW (ref 98–111)
Creatinine, Ser: 0.65 mg/dL (ref 0.44–1.00)
GFR calc Af Amer: >60 mL/min (ref >60)
GFR calc non Af Amer: >60 mL/min (ref >60)
Glucose, Bld: 141 mg/dL — ABNORMAL HIGH (ref 70–99)
Potassium: 3.1 mmol/L — ABNORMAL LOW (ref 3.5–5.1)
Sodium: 129 mmol/L — ABNORMAL LOW (ref 135–145)
Total Bilirubin: 0.7 mg/dL (ref 0.3–1.2)
Total Protein: 7.7 g/dL (ref 6.5–8.1)

## 2020-08-12 LAB — URINALYSIS, COMPLETE (UACMP) WITH MICROSCOPIC
Bacteria, UA: NONE SEEN
Bilirubin Urine: NEGATIVE
Glucose, UA: NEGATIVE mg/dL
Hgb urine dipstick: NEGATIVE
Ketones, ur: NEGATIVE mg/dL
Leukocytes,Ua: NEGATIVE
Nitrite: NEGATIVE
Protein, ur: NEGATIVE mg/dL
Specific Gravity, Urine: 1.016 (ref 1.005–1.030)
Squamous Epithelial / HPF: NONE SEEN (ref 0–5)
pH: 7 (ref 5.0–8.0)

## 2020-08-12 LAB — RESPIRATORY PANEL BY RT PCR (FLU A&B, COVID)
Influenza A by PCR: NEGATIVE
Influenza B by PCR: NEGATIVE
SARS Coronavirus 2 by RT PCR: NEGATIVE

## 2020-08-12 LAB — PROTIME-INR
INR: 1 (ref 0.8–1.2)
Prothrombin Time: 13 seconds (ref 11.4–15.2)

## 2020-08-12 LAB — LIPASE, BLOOD: Lipase: 23 U/L (ref 11–51)

## 2020-08-12 LAB — LACTIC ACID, PLASMA: Lactic Acid, Venous: 1.2 mmol/L (ref 0.5–1.9)

## 2020-08-12 LAB — APTT: aPTT: 30 seconds (ref 24–36)

## 2020-08-12 MED ORDER — SODIUM CHLORIDE 0.9 % IV SOLN: INTRAVENOUS | Status: DC

## 2020-08-12 MED ORDER — ONDANSETRON HCL 4 MG/2ML IJ SOLN
4.0000 mg | Freq: Once | INTRAMUSCULAR | Status: AC
  Administered 2020-08-12: 4 mg via INTRAVENOUS
  Filled 2020-08-12: qty 2

## 2020-08-12 MED ORDER — LABETALOL HCL 5 MG/ML IV SOLN
5.0000 mg | Freq: Once | INTRAVENOUS | Status: AC
  Administered 2020-08-12: 5 mg via INTRAVENOUS

## 2020-08-12 MED ORDER — SODIUM CHLORIDE 0.9 % IV BOLUS
1000.0000 mL | Freq: Once | INTRAVENOUS | Status: AC
  Administered 2020-08-12: 1000 mL via INTRAVENOUS

## 2020-08-12 MED ORDER — IOHEXOL 300 MG/ML  SOLN
75.0000 mL | Freq: Once | INTRAMUSCULAR | Status: AC | PRN
  Administered 2020-08-12: 75 mL via INTRAVENOUS

## 2020-08-12 MED ORDER — PANTOPRAZOLE SODIUM 40 MG IV SOLR
40.0000 mg | INTRAVENOUS | Status: DC
  Administered 2020-08-12: 40 mg via INTRAVENOUS
  Filled 2020-08-12: qty 40

## 2020-08-12 MED ORDER — ONDANSETRON HCL 4 MG PO TABS: 4.0000 mg | ORAL_TABLET | Freq: Four times a day (QID) | ORAL | Status: DC | PRN

## 2020-08-12 MED ORDER — HEPARIN (PORCINE) 25000 UT/250ML-% IV SOLN
850.0000 [IU]/h | INTRAVENOUS | Status: DC
  Administered 2020-08-12: 850 [IU]/h via INTRAVENOUS
  Filled 2020-08-12: qty 250

## 2020-08-12 MED ORDER — MORPHINE SULFATE (PF) 4 MG/ML IV SOLN
4.0000 mg | INTRAVENOUS | Status: DC | PRN
  Administered 2020-08-12 (×3): 4 mg via INTRAVENOUS
  Filled 2020-08-12 (×3): qty 1

## 2020-08-12 MED ORDER — PROMETHAZINE HCL 25 MG/ML IJ SOLN
12.5000 mg | Freq: Once | INTRAMUSCULAR | Status: AC
  Administered 2020-08-12: 12.5 mg via INTRAVENOUS

## 2020-08-12 MED ORDER — CARVEDILOL 6.25 MG PO TABS
3.1250 mg | ORAL_TABLET | Freq: Two times a day (BID) | ORAL | Status: DC
  Filled 2020-08-12: qty 1

## 2020-08-12 MED ORDER — HEPARIN BOLUS VIA INFUSION
2500.0000 [IU] | Freq: Once | INTRAVENOUS | Status: AC
  Administered 2020-08-12: 2500 [IU] via INTRAVENOUS
  Filled 2020-08-12: qty 2500

## 2020-08-12 MED ORDER — LABETALOL HCL 5 MG/ML IV SOLN
INTRAVENOUS | Status: AC
  Administered 2020-08-12: 5 mg via INTRAVENOUS
  Filled 2020-08-12: qty 4

## 2020-08-12 MED ORDER — ONDANSETRON HCL 4 MG/2ML IJ SOLN: 4.0000 mg | Freq: Four times a day (QID) | INTRAMUSCULAR | Status: DC | PRN

## 2020-08-12 NOTE — ED Triage Notes (Signed)
Pt in via EMS from home with c/o RUQ pain that started last pm. Pt with nausea since 3 this am. Pt dx'd with UTI 3 days ago. 191/104, pt with hx of HTN, has not taken meds this am, FSBS 132, HR 84, 96%RA

## 2020-08-12 NOTE — ED Notes (Signed)
Transport arrived.

## 2020-08-12 NOTE — Consult Note (Signed)
ANTICOAGULATION CONSULT NOTE - Initial Consult  Pharmacy Consult for Heparin Drip Indication:  AAA Thrombus  Allergies  Allergen Reactions  . Sulfa Antibiotics Rash    Patient Measurements: Height: 5\' 6"  (167.6 cm) Weight: 53.1 kg (117 lb) IBW/kg (Calculated) : 59.3 Heparin Dosing Weight: 53.1kg  Vital Signs: Temp: 98.4 F (36.9 C) (09/29 0753) Temp Source: Oral (09/29 0753) BP: 158/75 (09/29 1500) Pulse Rate: 66 (09/29 1500)  Labs: Recent Labs    08/12/20 0807  HGB 14.5  HCT 43.3  PLT 411*  CREATININE 0.65    Estimated Creatinine Clearance: 50.9 mL/min (by C-G formula based on SCr of 0.65 mg/dL).   Medical History: Past Medical History:  Diagnosis Date  . CAD (coronary artery disease)   . Chronic back pain   . Hyperlipidemia   . Hypertension   . Hypothyroidism   . Tobacco use     Medications:  No pta anticoagulation of record  Assessment: 74 yo female with RUQ pain - pharmacy has been consulted to initiate and monitor a heparin drip for AAA thrombus.  Goal of Therapy:  Heparin level 0.3-0.7 units/ml Monitor platelets by anticoagulation protocol: Yes   Plan:  Give 2500 units bolus x 1 Start heparin infusion at 850 units/hr Check anti-Xa level in 8 hours and daily while on heparin Continue to monitor H&H and platelets  Will assess baseline APTT and INR  61, PharmD, BCPS Clinical Pharmacist 08/12/2020 3:46 PM

## 2020-08-12 NOTE — ED Notes (Signed)
Pt accepted to [facility].  Awaiting transport

## 2020-08-12 NOTE — ED Provider Notes (Addendum)
[facility] Emergency Department Provider Note    First MD Initiated Contact with Patient 08/12/20 1237     (approximate)  I have reviewed the triage vital signs and the nursing notes.   HISTORY  Chief Complaint Abdominal Pain and Emesis    HPI Rebecca Peck is a 74 y.o. female below listed past medical history presents to the ER for evaluation of right upper quadrant abdominal pain starting last night associated nausea vomiting decreased p.o. intake.  She denies any chest pain or shortness of breath.   States she has had her gallbladder removed but feels like this is similar pain.  Has not taken anything for pain at home.   Past Medical History:  Diagnosis Date  . CAD (coronary artery disease)   . Chronic back pain   . Hyperlipidemia   . Hypertension   . Hypothyroidism   . Tobacco use    Family History  Problem Relation Age of Onset  . Hypertension Mother   . Hypertension Father    Past Surgical History:  Procedure Laterality Date  . FEMUR IM NAIL Right 05/13/2020   Procedure: INTRAMEDULLARY (IM) NAIL FEMORAL;  Surgeon: Kennedy Bucker, MD;  Location: [facility] ORS;  Service: Orthopedics;  Laterality: Right;  . LAPAROSCOPIC CHOLECYSTECTOMY  03/02/2016   Patient Active Problem List   Diagnosis Date Noted  . Aortic thrombus (HCC) 08/12/2020  . Protein-calorie malnutrition, severe 05/23/2020  . Intertrochanteric fracture of right femur, closed, initial encounter (HCC) 05/12/2020  . Hypothyroidism   . Hypertension   . Hyperlipidemia   . Chronic back pain   . CAD (coronary artery disease)   . Tobacco use       Prior to Admission medications   Medication Sig Start Date End Date Taking? Authorizing Provider  amLODipine (NORVASC) 5 MG tablet Take 10 mg by mouth daily. 01/17/20   [provider]  bisacodyl (DULCOLAX) 10 MG suppository Place 1 suppository (10 mg total) rectally daily as needed for moderate constipation. 05/27/20   Marrion Coy, MD  carvedilol (COREG) 3.125 MG tablet Take 3.125 mg by mouth 2 (two) times daily. 03/27/20   [provider]  enoxaparin (LOVENOX) 40 MG/0.4ML injection Inject 0.4 mLs (40 mg total) into the skin daily for 14 days. 05/15/20 05/29/20  Evon Slack, PA-C  ferrous fumarate-b12-vitamic C-folic acid (TRINSICON / FOLTRIN) capsule Take 1 capsule by mouth 2 (two) times daily. 05/27/20   Marrion Coy, MD  fluticasone (FLONASE) 50 MCG/ACT nasal spray Place 1 spray into both nostrils daily as needed for allergies. 03/29/20   [provider]  levothyroxine (SYNTHROID) 125 MCG tablet Take 125 mcg by mouth daily. 03/27/20   [provider]  lisinopril (ZESTRIL) 20 MG tablet Take 20 mg by mouth daily. 04/04/20   [provider]  Oxycodone HCl 10 MG TABS Take 1 tablet (10 mg total) by mouth 5 (five) times daily as needed. 05/27/20   Marrion Coy, MD  pantoprazole (PROTONIX) 40 MG tablet Take 40 mg by mouth daily. 12/30/19   [provider]  polyethylene glycol (MIRALAX / GLYCOLAX) 17 g packet Take 17 g by mouth daily as needed for mild constipation or moderate constipation. 05/27/20   Marrion Coy, MD  pravastatin (PRAVACHOL) 20 MG tablet Take 20 mg by mouth daily. 03/29/20   [provider]  pregabalin (LYRICA) 25 MG capsule Take 1 capsule (25 mg total) by mouth daily. 05/28/20   Marrion Coy, MD  promethazine (PHENERGAN) 25 MG tablet  Take 25 mg by mouth every 8 (eight) hours as needed for nausea or vomiting. 05/06/20   [provider]  senna-docusate (SENOKOT-S) 8.6-50 MG tablet Take 2 tablets by mouth 2 (two) times daily. 05/27/20   Marrion Coy, MD  sodium chloride 1 g tablet Take 1 tablet (1 g total) by mouth daily. 05/27/20   Marrion Coy, MD    Allergies Sulfa antibiotics    Social History Social History   Tobacco Use  . Smoking status: Current Every Day Smoker    Packs/day: 0.50  . Smokeless tobacco: Never Used  Substance Use Topics  .  Alcohol use: Not Currently  . Drug use: Never    Review of Systems Patient denies headaches, rhinorrhea, blurry vision, numbness, shortness of breath, chest pain, edema, cough, abdominal pain, nausea, vomiting, diarrhea, dysuria, fevers, rashes or hallucinations unless otherwise stated above in HPI. ____________________________________________   PHYSICAL EXAM:  VITAL SIGNS: Vitals:   08/12/20 1450 08/12/20 1500  BP: (!) 165/78 (!) 158/75  Pulse: 68 66  Resp: 11 10  Temp:    SpO2: 97%     Constitutional: Alert and oriented.  Eyes: Conjunctivae are normal.  Head: Atraumatic. Nose: No congestion/rhinnorhea. Mouth/Throat: Mucous membranes are moist.   Neck: No stridor. Painless ROM.  Cardiovascular: Normal rate, irregular rhythm. Grossly normal heart sounds.  BLE  Warm,  Doppler signals present to Bilat DP and PT Respiratory: Normal respiratory effort.  No retractions. Lungs CTAB. Gastrointestinal: Soft generalized ttp, more notable in RUQ. No distention. No abdominal bruits. No CVA tenderness. Genitourinary:  Musculoskeletal: No lower extremity tenderness nor edema.  No joint effusions. Neurologic:  Normal speech and language. No gross focal neurologic deficits are appreciated. No facial droop Skin:  Skin is warm, dry and intact. No rash noted. Psychiatric: Mood and affect are normal. Speech and behavior are normal.  ____________________________________________   LABS (all labs ordered are listed, but only abnormal results are displayed)  Results for orders placed or performed during the hospital encounter of 08/12/20 (from the past 24 hour(s))  Lipase, blood     Status: None   Collection Time: 08/12/20  8:07 AM  Result Value Ref Range   Lipase 23 11 - 51 U/L  Comprehensive metabolic panel     Status: Abnormal   Collection Time: 08/12/20  8:07 AM  Result Value Ref Range   Sodium 129 (L) 135 - 145 mmol/L   Potassium 3.1 (L) 3.5 - 5.1 mmol/L   Chloride 89 (L) 98 - 111  mmol/L   CO2 29 22 - 32 mmol/L   Glucose, Bld 141 (H) 70 - 99 mg/dL   BUN 8 8 - 23 mg/dL   Creatinine, Ser 1.02 0.44 - 1.00 mg/dL   Calcium 9.1 8.9 - 72.5 mg/dL   Total Protein 7.7 6.5 - 8.1 g/dL   Albumin 3.5 3.5 - 5.0 g/dL   AST 23 15 - 41 U/L   ALT 17 0 - 44 U/L   Alkaline Phosphatase 115 38 - 126 U/L   Total Bilirubin 0.7 0.3 - 1.2 mg/dL   GFR calc non Af Amer >60 >60 mL/min   GFR calc Af Amer >60 >60 mL/min   Anion gap 11 5 - 15  CBC     Status: Abnormal   Collection Time: 08/12/20  8:07 AM  Result Value Ref Range   WBC 14.1 (H) 4.0 - 10.5 K/uL   RBC 5.06 3.87 - 5.11 MIL/uL   Hemoglobin 14.5 12.0 - 15.0 g/dL  HCT 43.3 36 - 46 %   MCV 85.6 80.0 - 100.0 fL   MCH 28.7 26.0 - 34.0 pg   MCHC 33.5 30.0 - 36.0 g/dL   RDW 09.815.6 (H) 11.911.5 - 14.715.5 %   Platelets 411 (H) 150 - 400 K/uL   nRBC 0.0 0.0 - 0.2 %  Urinalysis, Complete w Microscopic     Status: Abnormal   Collection Time: 08/12/20  2:10 PM  Result Value Ref Range   Color, Urine YELLOW (A) YELLOW   APPearance CLEAR (A) CLEAR   Specific Gravity, Urine 1.016 1.005 - 1.030   pH 7.0 5.0 - 8.0   Glucose, UA NEGATIVE NEGATIVE mg/dL   Hgb urine dipstick NEGATIVE NEGATIVE   Bilirubin Urine NEGATIVE NEGATIVE   Ketones, ur NEGATIVE NEGATIVE mg/dL   Protein, ur NEGATIVE NEGATIVE mg/dL   Nitrite NEGATIVE NEGATIVE   Leukocytes,Ua NEGATIVE NEGATIVE   RBC / HPF 0-5 0 - 5 RBC/hpf   WBC, UA 0-5 0 - 5 WBC/hpf   Bacteria, UA NONE SEEN NONE SEEN   Squamous Epithelial / LPF NONE SEEN 0 - 5   Mucus PRESENT   Lactic acid, plasma     Status: None   Collection Time: 08/12/20  3:23 PM  Result Value Ref Range   Lactic Acid, Venous 1.2 0.5 - 1.9 mmol/L  APTT     Status: None   Collection Time: 08/12/20  4:15 PM  Result Value Ref Range   aPTT 30 24 - 36 seconds  Protime-INR     Status: None   Collection Time: 08/12/20  4:15 PM  Result Value Ref Range   Prothrombin Time 13.0 11.4 - 15.2 seconds   INR 1.0 0.8 - 1.2    ____________________________________________  EKG My review and personal interpretation at Time: 7:56   Indication: abd pain  Rate: 110  Rhythm: afib Axis: normal Other: nonspecific st abn, no stemi ____________________________________________  RADIOLOGY  I personally reviewed all radiographic images ordered to evaluate for the above acute complaints and reviewed radiology reports and findings.  These findings were personally discussed with the patient.  Please see medical record for radiology report.  ____________________________________________   PROCEDURES  Procedure(s) performed:  .Critical Care Performed by: Willy Eddyobinson, Marlowe Cinquemani, MD Authorized by: Willy Eddyobinson, Farida Mcreynolds, MD   Critical care provider statement:    Critical care time (minutes):  35   Critical care time was exclusive of:  Separately billable procedures and treating other patients   Critical care was necessary to treat or prevent imminent or life-threatening deterioration of the following conditions:  Circulatory failure   Critical care was time spent personally by me on the following activities:  Development of treatment plan with patient or surrogate, discussions with consultants, evaluation of patient's response to treatment, examination of patient, obtaining history from patient or surrogate, ordering and performing treatments and interventions, ordering and review of laboratory studies, ordering and review of radiographic studies, pulse oximetry, re-evaluation of patient's condition and review of old charts      Critical Care performed: no ____________________________________________   INITIAL IMPRESSION / ASSESSMENT AND PLAN / ED COURSE  Pertinent labs & imaging results that were available during my care of the patient were reviewed by me and considered in my medical decision making (see chart for details).   DDX: Enteritis, SBO, gastritis, pancreatitis, AAA, mesenteric ischemia  Tomasita CrumbleJennifer S Dilger is a 74  y.o. who presents to the ED with presentation as described above.  Patient ill-appearing pain out of proportion to  exam complaining of nausea vomiting.  Patient given fluids as well as IV narcotic medication IV antiemetics.  CT imaging ordered for the above differential shows no evidence of obstruction but does show evidence of 4.4 centimeters AAA with evidence of occlusive thrombus raising concern for intermittent mesenteric ischemia.  Will add on lactate.  Will consult vascular surgery.  Clinical Course as of Aug 12 1641  Wed Aug 12, 2020  1447 Results discussed with patient and family.  I have consulted vascular surgery who is currently in the OR for further recommendations.  She states that her symptoms are improving but still feeling nauseated.   [PR]  1630 After evaluation by vascular surgery, Dr. Gilda Crease, has recommended transfer to higher level of care.  Patient and family have requested [facility].  Patient has been accepted to [facility] for further management.   [PR]    Clinical Course User Index [PR] Willy Eddy, MD    The patient was evaluated in Emergency Department today for the symptoms described in the history of present illness. He/she was evaluated in the context of the global COVID-19 pandemic, which necessitated consideration that the patient might be at risk for infection with the SARS-CoV-2 virus that causes COVID-19. Institutional protocols and algorithms that pertain to the evaluation of patients at risk for COVID-19 are in a state of rapid change based on information released by regulatory bodies including the CDC and federal and state organizations. These policies and algorithms were followed during the patient's care in the ED.  As part of my medical decision making, I reviewed the following data within the electronic MEDICAL RECORD NUMBER Nursing notes reviewed and incorporated, Labs reviewed, notes from prior ED visits and [place] Controlled Substance  Database   ____________________________________________   FINAL CLINICAL IMPRESSION(S) / ED DIAGNOSES  Final diagnoses:  Abdominal aortic aneurysm (AAA) without rupture (HCC)  Generalized abdominal pain  Aortic thrombus (HCC)      NEW MEDICATIONS STARTED DURING THIS VISIT:  New Prescriptions   No medications on file     Note:  This document was prepared using Dragon voice recognition software and may include unintentional dictation errors.    Willy Eddy, MD 08/12/20 [phone]

## 2020-08-12 NOTE — H&P (Signed)
History and Physical    Rebecca Peck MPN:[id number] DOB: 04-23-1946 DOA: 08/12/2020  PCP: Patient, No Pcp Per   Patient coming from: Home I have personally briefly reviewed patient's old medical records in [facility]  Chief Complaint: Abdominal pain  HPI: Rebecca Peck is a 74 y.o. female with medical history significant for nicotine dependence, coronary artery disease, hypothyroidism, hypertension abdominal aortic aneurysm and chronic low back pain who presents to the emergency room for evaluation of pain mostly in the periumbilical and lower quadrant.  She rates her pain about an 8 x 10 in intensity at its worst.  Pain is nonradiating and is associated with nausea.  Over the weekend patient stated that she had multiple episodes of emesis and was constipated as well.  She had taken some stool softeners without any results.  She was started on antibiotic therapy for UTI about 3 days prior to her admission She denies having any fever or chills, no dizziness, no lightheadedness, no cough, no chest pain or shortness of breath. Labs show sodium of 129, potassium 3.1, chloride 89, bicarb 29, glucose 141, BUN 8, creatinine 0.65, calcium 9.1, lipase 23, AST 23, ALT 17, total protein 7.7, lactic acid 1.2, white count 14.1, hemoglobin 14.5, hematocrit 43.3, MCV 85.6, RDW 15.6, platelet count 411. Urinalysis sterile CT scan of abdomen pelvis shows aortoiliac occlusive disease with fusiform infrarenal abdominal aortic aneurysm measuring up to 4.4 cm, unchanged in size from prior study.  Aneurysm sac appears completely thrombosed.  Bilateral common iliac arteries also appear thrombosed.  External iliac arteries are reconstituted by collateral pathways.  Bilateral infrarenal and iliac arteries also appear reconstituted distally.  Findings suggestive of a chronic, longstanding occlusive process.  Vascular surgery consultation is recommended. 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 Chest x-ray reviewed by me shows mild interstitial changes. Twelve-lead EKG reviewed by me shows atrial fibrillation with rapid ventricular response    ED Course: Patient is a 74 year old female with a history of an abdominal aortic aneurysm, history of coronary artery disease, history of nicotine dependence who presents to the ER for evaluation of abdominal pain mostly periumbilical and right lower quadrant associated with nausea and emesis.  She rated her pain an 8 x 10 in intensity at its worst.  CT scan of the abdomen and pelvis showed aortoiliac occlusive disease with fusiform infrarenal abdominal aortic aneurysm measuring up to 4.4 cm, unchanged in size from prior study. Aneurysm sac appears completely thrombosed. Bilateral common iliac arteries also appear thrombosed. External iliac arteries are reconstituted by collateral pathway. Bilateral internal iliacarteries also appear reconstituted distally. Findings suggestive of a chronic, longstanding occlusive process. Vascular surgery consultation is recommended. 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  ER consulted vascular surgery who recommends transfer to higher level of care.  Patient has been started on a heparin drip and will be admitted pending bed availability at [facility]  Review of Systems: As per HPI otherwise 10 point review of systems negative.    Past Medical History:  Diagnosis Date  . CAD (coronary artery disease)   . Chronic back pain   . Hyperlipidemia   . Hypertension   . Hypothyroidism   . Tobacco use     Past Surgical History:  Procedure Laterality Date  . FEMUR IM NAIL Right 05/13/2020   Procedure: INTRAMEDULLARY (IM) NAIL FEMORAL;  Surgeon: Kennedy Bucker, MD;  Location: [facility] ORS;  Service: Orthopedics;  Laterality: Right;  . LAPAROSCOPIC CHOLECYSTECTOMY   03/02/2016     reports that she has been smoking. She has been smoking about 0.50 packs per day. She has never used smokeless tobacco. She reports previous alcohol use. She reports that she does not use drugs.  Allergies  Allergen Reactions  . Sulfa Antibiotics Rash    Family History  Problem Relation Age of Onset  . Hypertension Mother   . Hypertension Father      Prior to Admission medications   Medication Sig Start Date End Date Taking? Authorizing Provider  amLODipine (NORVASC) 5 MG tablet Take 10 mg by mouth daily. 01/17/20   [provider]  bisacodyl (DULCOLAX) 10 MG suppository Place 1 suppository (10 mg total) rectally daily as needed for moderate constipation. 05/27/20   Marrion Coy, MD  carvedilol (COREG) 3.125 MG tablet Take 3.125 mg by mouth 2 (two) times daily. 03/27/20   [provider]  enoxaparin (LOVENOX) 40 MG/0.4ML injection Inject 0.4 mLs (40 mg total) into the skin daily for 14 days. 05/15/20 05/29/20  Evon Slack, PA-C  ferrous fumarate-b12-vitamic C-folic acid (TRINSICON / FOLTRIN) capsule Take 1 capsule by mouth 2 (two) times daily. 05/27/20   Marrion Coy, MD  fluticasone (FLONASE) 50 MCG/ACT nasal spray Place 1 spray into both nostrils daily as needed for allergies. 03/29/20   [provider]  levothyroxine (SYNTHROID) 125 MCG tablet Take 125 mcg by mouth daily. 03/27/20   [provider]  lisinopril (ZESTRIL) 20 MG tablet Take 20 mg by mouth daily. 04/04/20   [provider]  Oxycodone HCl 10 MG TABS Take 1 tablet (10 mg total) by mouth 5 (five) times daily as needed. 05/27/20   Marrion Coy, MD  pantoprazole (PROTONIX) 40 MG tablet Take 40 mg by mouth daily. 12/30/19   [provider]  polyethylene glycol (MIRALAX / GLYCOLAX) 17 g packet Take 17 g by mouth daily as needed for mild constipation or moderate constipation. 05/27/20   Marrion Coy, MD  pravastatin (PRAVACHOL) 20 MG tablet Take 20 mg by mouth daily.  03/29/20   [provider]  pregabalin (LYRICA) 25 MG capsule Take 1 capsule (25 mg total) by mouth daily. 05/28/20   Marrion Coy, MD  promethazine (PHENERGAN) 25 MG tablet Take 25 mg by mouth every 8 (eight) hours as needed for nausea or vomiting. 05/06/20   [provider]  senna-docusate (SENOKOT-S) 8.6-50 MG tablet Take 2 tablets by mouth 2 (two) times daily. 05/27/20   Marrion Coy, MD  sodium chloride 1 g tablet Take 1 tablet (1 g total) by mouth daily. 05/27/20   Marrion Coy, MD    Physical Exam: Vitals:   08/12/20 1440 08/12/20 1446 08/12/20 1450 08/12/20 1500  BP: (!) 160/79  (!) 165/78 (!) 158/75  Pulse: 66 66 68 66  Resp:  13 11 10   Temp:      TempSrc:      SpO2:   97%   Weight:      Height:         Vitals:   08/12/20 1440 08/12/20 1446 08/12/20 1450 08/12/20 1500  BP: (!) 160/79  (!) 165/78 (!) 158/75  Pulse: 66 66 68 66  Resp:  13 11 10   Temp:      TempSrc:      SpO2:   97%   Weight:      Height:  Constitutional: NAD, alert and oriented x 3.  Frail and chronically ill-appearing Eyes: PERRL, lids and conjunctivae pallor ENMT: Mucous membranes are moist.  Neck: normal, supple, no masses, no thyromegaly Respiratory: clear to auscultation bilaterally, no wheezing, no crackles. Normal respiratory effort. No accessory muscle use.  Cardiovascular: Regular rate and rhythm, no murmurs / rubs / gallops. No extremity edema. 2+ pedal pulses. No carotid bruits.  Abdomen:  tenderness periumbilical and right lower quadrant, no masses palpated. No hepatosplenomegaly. Bowel sounds positive.  Musculoskeletal: no clubbing / cyanosis. No joint deformity upper and lower extremities.  Skin: no rashes, lesions, ulcers.  Neurologic: No gross focal neurologic deficit. Psychiatric: Normal mood and affect.   Labs on Admission: I have personally reviewed following labs and imaging studies  CBC: Recent Labs  Lab 08/12/20 0807  WBC 14.1*  HGB 14.5  HCT 43.3   MCV 85.6  PLT 411*   Basic Metabolic Panel: Recent Labs  Lab 08/12/20 0807  NA 129*  K 3.1*  CL 89*  CO2 29  GLUCOSE 141*  BUN 8  CREATININE 0.65  CALCIUM 9.1   GFR: Estimated Creatinine Clearance: 50.9 mL/min (by C-G formula based on SCr of 0.65 mg/dL). Liver Function Tests: Recent Labs  Lab 08/12/20 0807  AST 23  ALT 17  ALKPHOS 115  BILITOT 0.7  PROT 7.7  ALBUMIN 3.5   Recent Labs  Lab 08/12/20 0807  LIPASE 23   No results for input(s): AMMONIA in the last 168 hours. Coagulation Profile: Recent Labs  Lab 08/12/20 1615  INR 1.0   Cardiac Enzymes: No results for input(s): CKTOTAL, CKMB, CKMBINDEX, TROPONINI in the last 168 hours. BNP (last 3 results) No results for input(s): PROBNP in the last 8760 hours. HbA1C: No results for input(s): HGBA1C in the last 72 hours. CBG: No results for input(s): GLUCAP in the last 168 hours. Lipid Profile: No results for input(s): CHOL, HDL, LDLCALC, TRIG, CHOLHDL, LDLDIRECT in the last 72 hours. Thyroid Function Tests: No results for input(s): TSH, T4TOTAL, FREET4, T3FREE, THYROIDAB in the last 72 hours. Anemia Panel: No results for input(s): VITAMINB12, FOLATE, FERRITIN, TIBC, IRON, RETICCTPCT in the last 72 hours. Urine analysis:    Component Value Date/Time   COLORURINE YELLOW (A) 08/12/2020 1410   APPEARANCEUR CLEAR (A) 08/12/2020 1410   LABSPEC 1.016 08/12/2020 1410   PHURINE 7.0 08/12/2020 1410   GLUCOSEU NEGATIVE 08/12/2020 1410   HGBUR NEGATIVE 08/12/2020 1410   BILIRUBINUR NEGATIVE 08/12/2020 1410   KETONESUR NEGATIVE 08/12/2020 1410   PROTEINUR NEGATIVE 08/12/2020 1410   NITRITE NEGATIVE 08/12/2020 1410   LEUKOCYTESUR NEGATIVE 08/12/2020 1410    Radiological Exams on Admission: CT ABDOMEN PELVIS W CONTRAST  Result Date: 08/12/2020 CLINICAL DATA:  Nausea and vomiting.  Leukocytosis EXAM: CT ABDOMEN AND PELVIS WITH CONTRAST TECHNIQUE: Multidetector CT imaging of the abdomen and pelvis was performed  using the standard protocol following bolus administration of intravenous contrast. CONTRAST:  75mL OMNIPAQUE IOHEXOL 300 MG/ML  SOLN COMPARISON:  05/17/2020 FINDINGS: Lower chest: No acute abnormality. Hepatobiliary: No focal liver lesion is seen. Prior cholecystectomy. Moderate intrahepatic and extrahepatic biliary dilatation, which is likely postsurgical. Pancreas: Unremarkable. No pancreatic ductal dilatation or surrounding inflammatory changes. Spleen: Normal in size without focal abnormality. Adrenals/Urinary Tract: Unremarkable adrenal glands. Right renal atrophy with compensatory hypertrophy of the left kidney. Small area of cortical scarring at the inferior pole of the left kidney. No renal stone or hydronephrosis. Urinary bladder is mildly distended. There is subtle layering slightly hyperdense material within  the dependent portion of the bladder lumen (series 2, image 65). Stomach/Bowel: Small hiatal hernia. Stomach is otherwise unremarkable. No dilated loops of small bowel. No focal colonic thickening or pericolonic inflammatory changes. Moderate volume of stool within the colon. Vascular/Lymphatic: Extensive intramural noncalcified atherosclerotic plaque within the visualized distal descending thoracic aorta and abdominal aorta. There is approximately 50% luminal narrowing of the aorta at the level of the celiac axis. Celiac origin is patent. 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 Fusiform infrarenal abdominal aortic aneurysm measuring 4.4 x 4.3 cm (series 2, image 31), unchanged. Aneurysm sac appears completely thrombosed. Bilateral common iliac arteries also appear thrombosed. External iliac arteries bilaterally are reconstituted by collateral pathway (series 2, image 64). Bilateral internal iliac arteries also appear reconstituted distally. Reproductive: Status post  hysterectomy. No adnexal masses. Other: No free fluid. No abdominopelvic fluid collection. No pneumoperitoneum. No abdominal wall hernia. Musculoskeletal: Chronic superior endplate compression deformity of L3, unchanged from prior. Severe degenerative disc disease of L4-5. No new or acute osseous findings. Prior ORIF of the proximal right femur. IMPRESSION: 1. Aortoiliac occlusive disease with fusiform infrarenal abdominal aortic aneurysm measuring up to 4.4 cm, unchanged in size from prior study. Aneurysm sac appears completely thrombosed. Bilateral common iliac arteries also appear thrombosed. External iliac arteries are reconstituted by collateral pathway. Bilateral internal iliac arteries also appear reconstituted distally. Findings suggestive of a chronic, longstanding occlusive process. Vascular surgery consultation is recommended. 2. 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 3. Moderate volume of stool within the colon. 4. Prior cholecystectomy with moderate intrahepatic and extrahepatic biliary dilatation, which is likely postsurgical. 5. Subtle layering slightly hyperdense material within the dependent portion of the bladder lumen favored to represent a small amount of debris secondary to stasis. Aortic Atherosclerosis (ICD10-I70.0). These results were discussed by telephone at the time of interpretation on 08/12/2020 at 1:54 pm to provider Willy Eddy , who verbally acknowledged these results. Electronically Signed   By: Duanne Guess D.O.   On: 08/12/2020 14:02   DG Chest Portable 1 View  Result Date: 08/12/2020 CLINICAL DATA:  Right upper quadrant pain and nausea EXAM: PORTABLE CHEST 1 VIEW COMPARISON:  05/12/2020 FINDINGS: Cardiac shadow is within normal limits. Aortic calcifications are seen. The lungs are well aerated bilaterally. Mild interstitial changes are seen without focal infiltrate. These are  likely chronic in nature. No acute bony abnormality is noted. IMPRESSION: Mild interstitial changes likely of a chronic nature. Electronically Signed   By: Alcide Clever M.D.   On: 08/12/2020 13:51    EKG: Independently reviewed.  Atrial Fibrillation with RVR  Assessment/Plan Principal Problem:   Aortic thrombus (HCC) Active Problems:   CAD (coronary artery disease)   Hypothyroidism   Hypertension   Tobacco use   Atrial fibrillation with RVR (HCC)   Hyponatremia      Aortic thrombus Patient presents for evaluation of abdominal pain mostly periumbilical and right lower quadrant associated with nausea and vomiting. CT scan of abdomen and pelvis shows  aortoiliac occlusive disease with fusiform infrarenal abdominal aortic aneurysm measuring up to 4.4 cm, unchanged in size from prior study. Aneurysm sac appears completely thrombosed. Bilateral common iliac arteries also appear thrombosed. External iliac arteries are reconstituted by collateral pathway. Bilateral internal iliac arteries also appear reconstituted distally. Findings suggestive of a chronic, longstanding occlusive  process. Vascular surgery consultation is recommended. 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 Place patient on heparin drip per protocol Vascular surgery was consulted and they recommend transfer to tertiary center for further evaluation. [facility]   Atrial fibrillation with rapid ventricular rate New onset A. Fib Patient has a CHADSVASC2 score of 5 and ideally requires long-term anticoagulation as primary prophylaxis for an acute stroke. Continue carvedilol for rate control Patient is currently on heparin drip    Nicotine dependence Patient smokes 1/2 pack of cigarettes daily Smoking cessation was discussed with her in detail She declines a nicotine transdermal  patch     Hypothyroidism Continue Synthroid    Hyponatremia Secondary to poor oral intake and GI loss We will place patient on normal saline    Hypertension Continue carvedilol Place patient on IV hydralazine to optimize blood pressure control       DVT prophylaxis: Heparin Code Status: Full code Family Communication: Greater than 50% of time was spent discussing plan of care with patient and her daughter at the bedside.  All questions and concerns have been addressed.  They verbalized understanding and agree with the plan.  CODE STATUS was discussed and patient is a full code Disposition Plan: Back to previous home environment Consults called: Vascular surgery    Dejanee Thibeaux MD [facility]     08/12/2020, 5:06 PM

## 2021-04-14 DEATH — deceased

## 2021-08-14 IMAGING — CR DG FEMUR 2+V*R*
1 series · 4 of 4 positions shown · non-contrast
Comparison: None.

CLINICAL DATA: Fall.  Right hip pain

EXAM:
RIGHT FEMUR 2 VIEWS

[Series 1: dg femur, min 2 views right · 0.14mm/px · 4 of 4 slices shown]
[im 1/4]
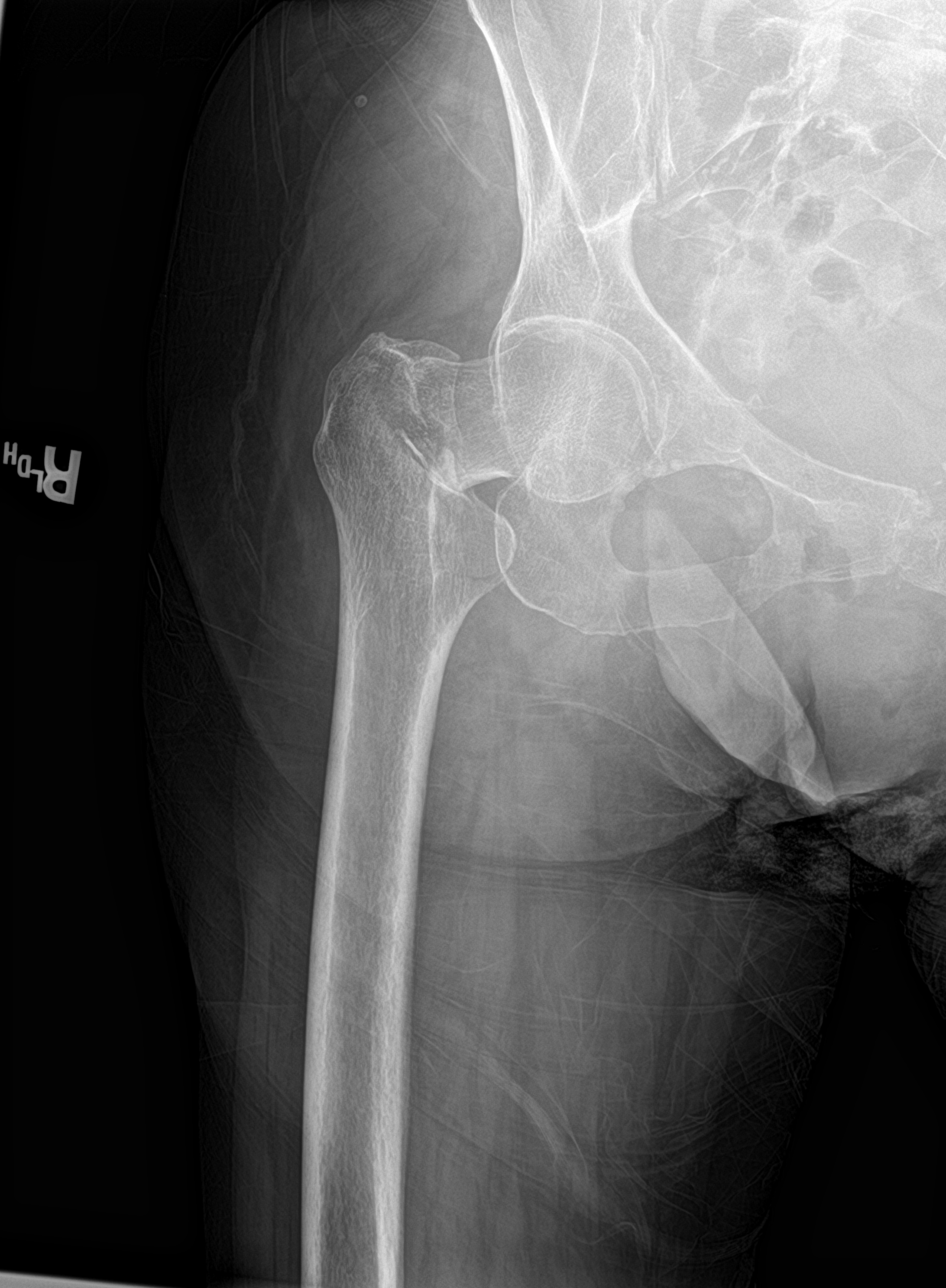
[im 2/4]
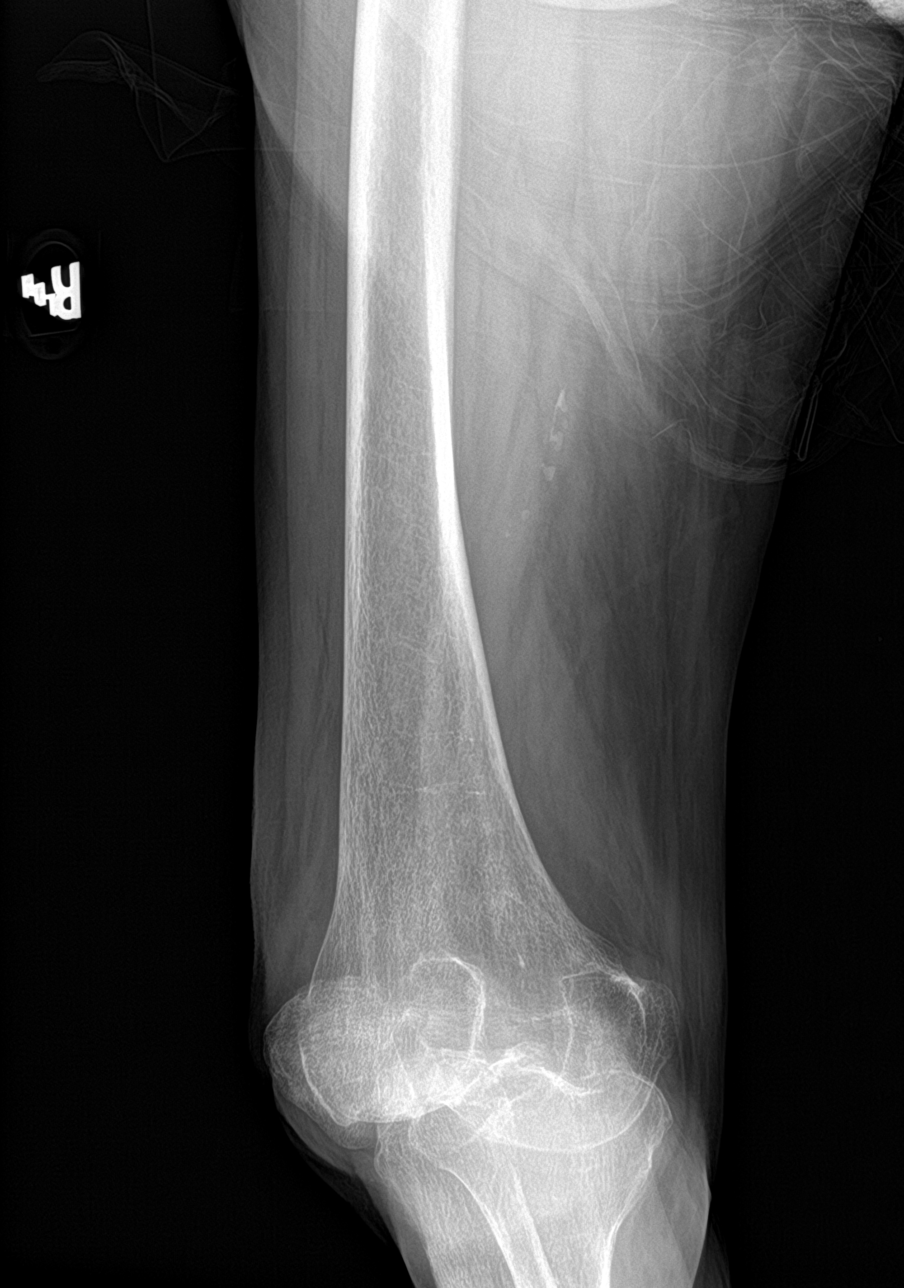
[im 3/4]
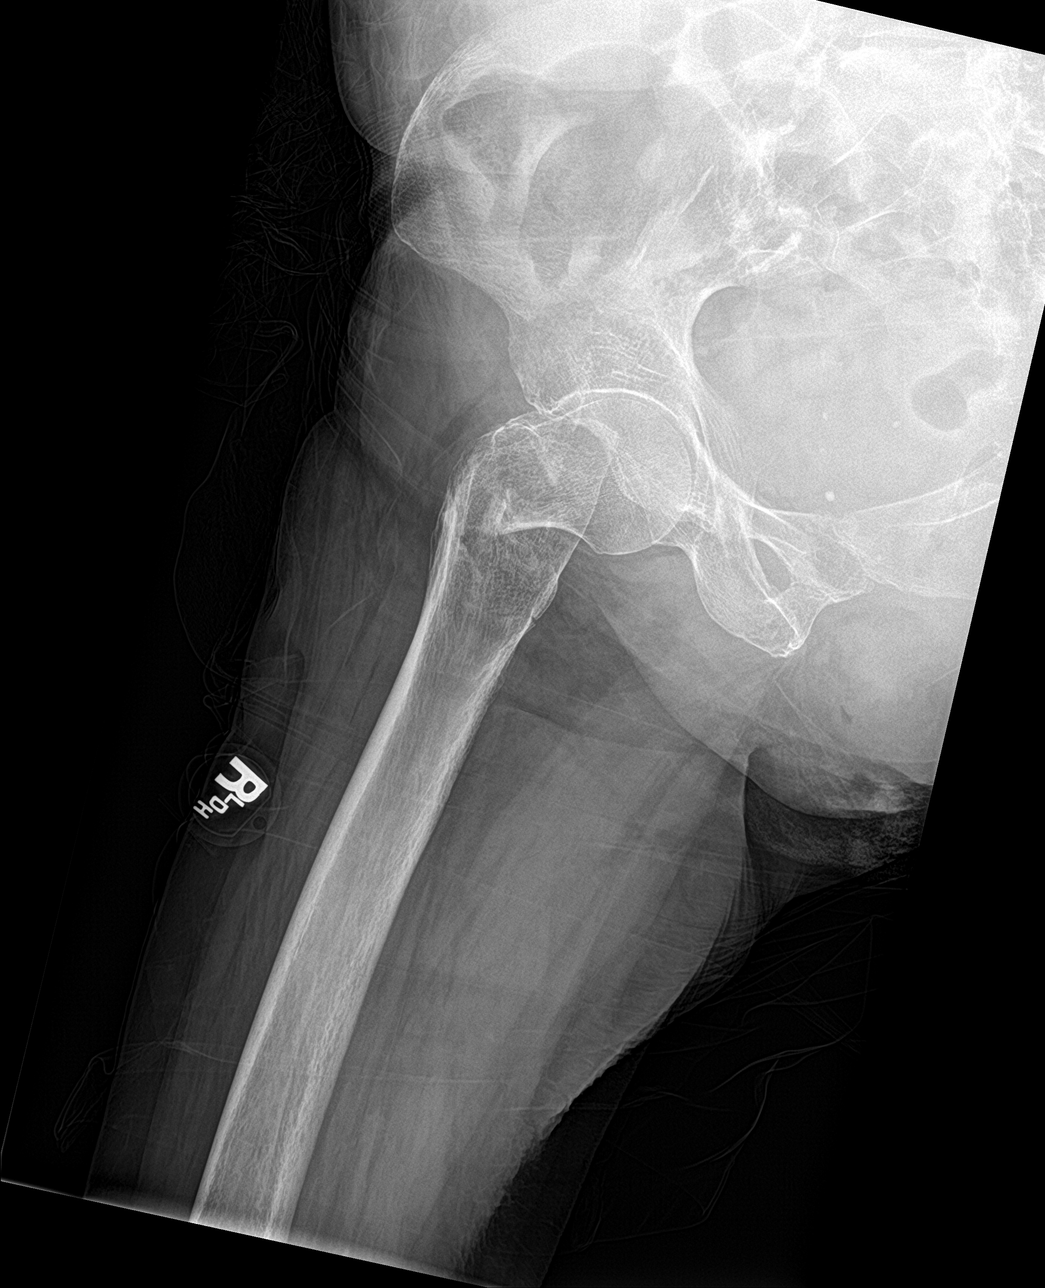
[im 4/4]
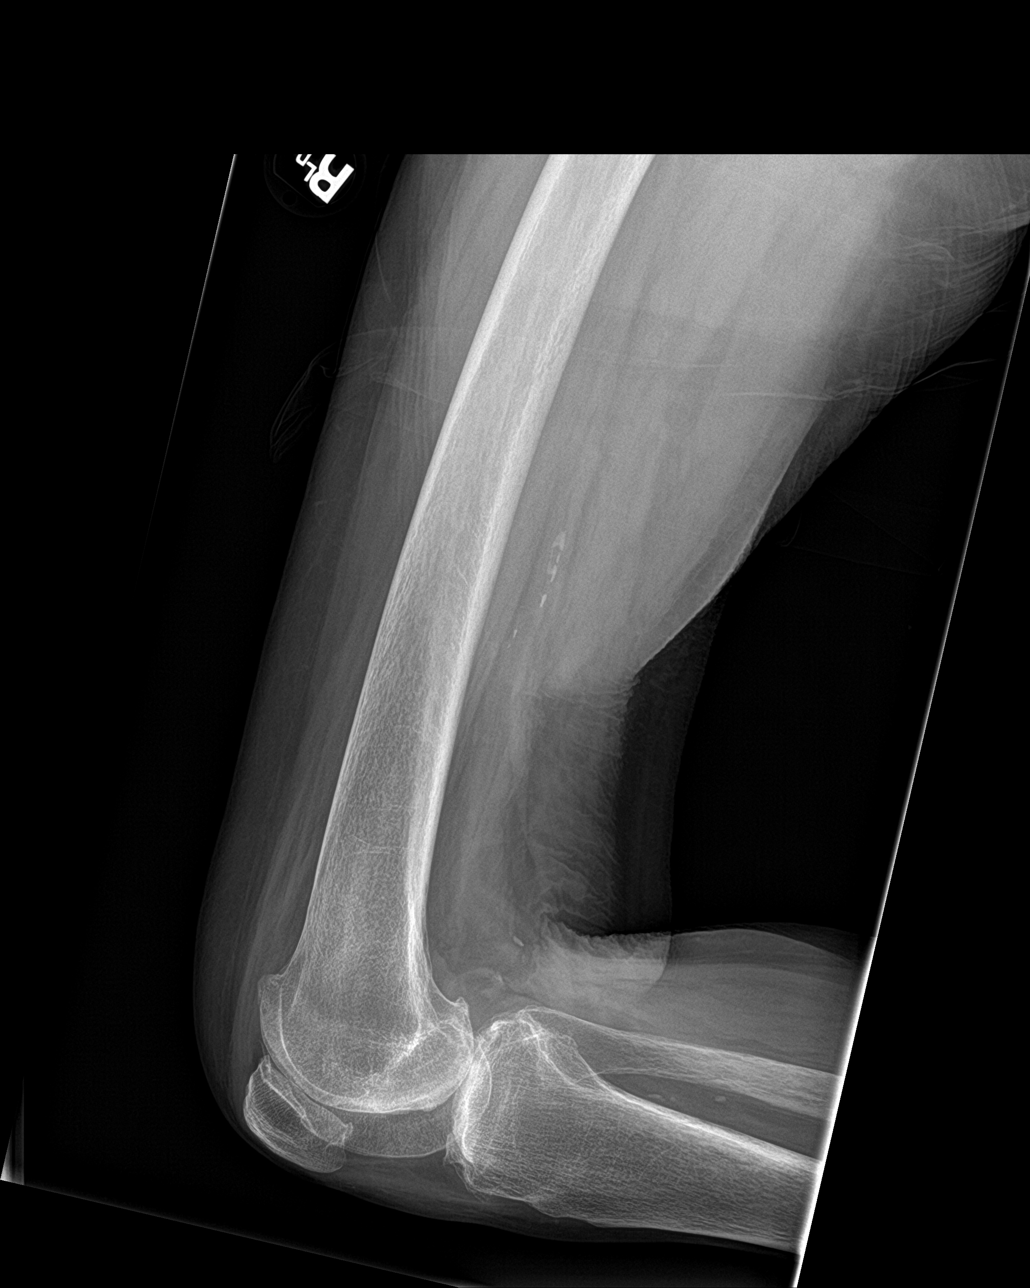

[4 of 4 positions shown; findings below may reference images not displayed]

FINDINGS: There is a right femoral intertrochanteric fracture with varus
angulation. No subluxation or dislocation. No additional acute
femoral abnormality. No joint effusion within the right knee.
IMPRESSION: Right femoral intertrochanteric fracture with varus angulation.

## 2021-08-14 IMAGING — CR DG PELVIS 1-2V
1 series · 1 of 1 positions shown · non-contrast
Comparison: None.

CLINICAL DATA: Fall, right hip pain, leg pain

EXAM:
PELVIS - 1-2 VIEW

[dg pelvis 1-2 views]
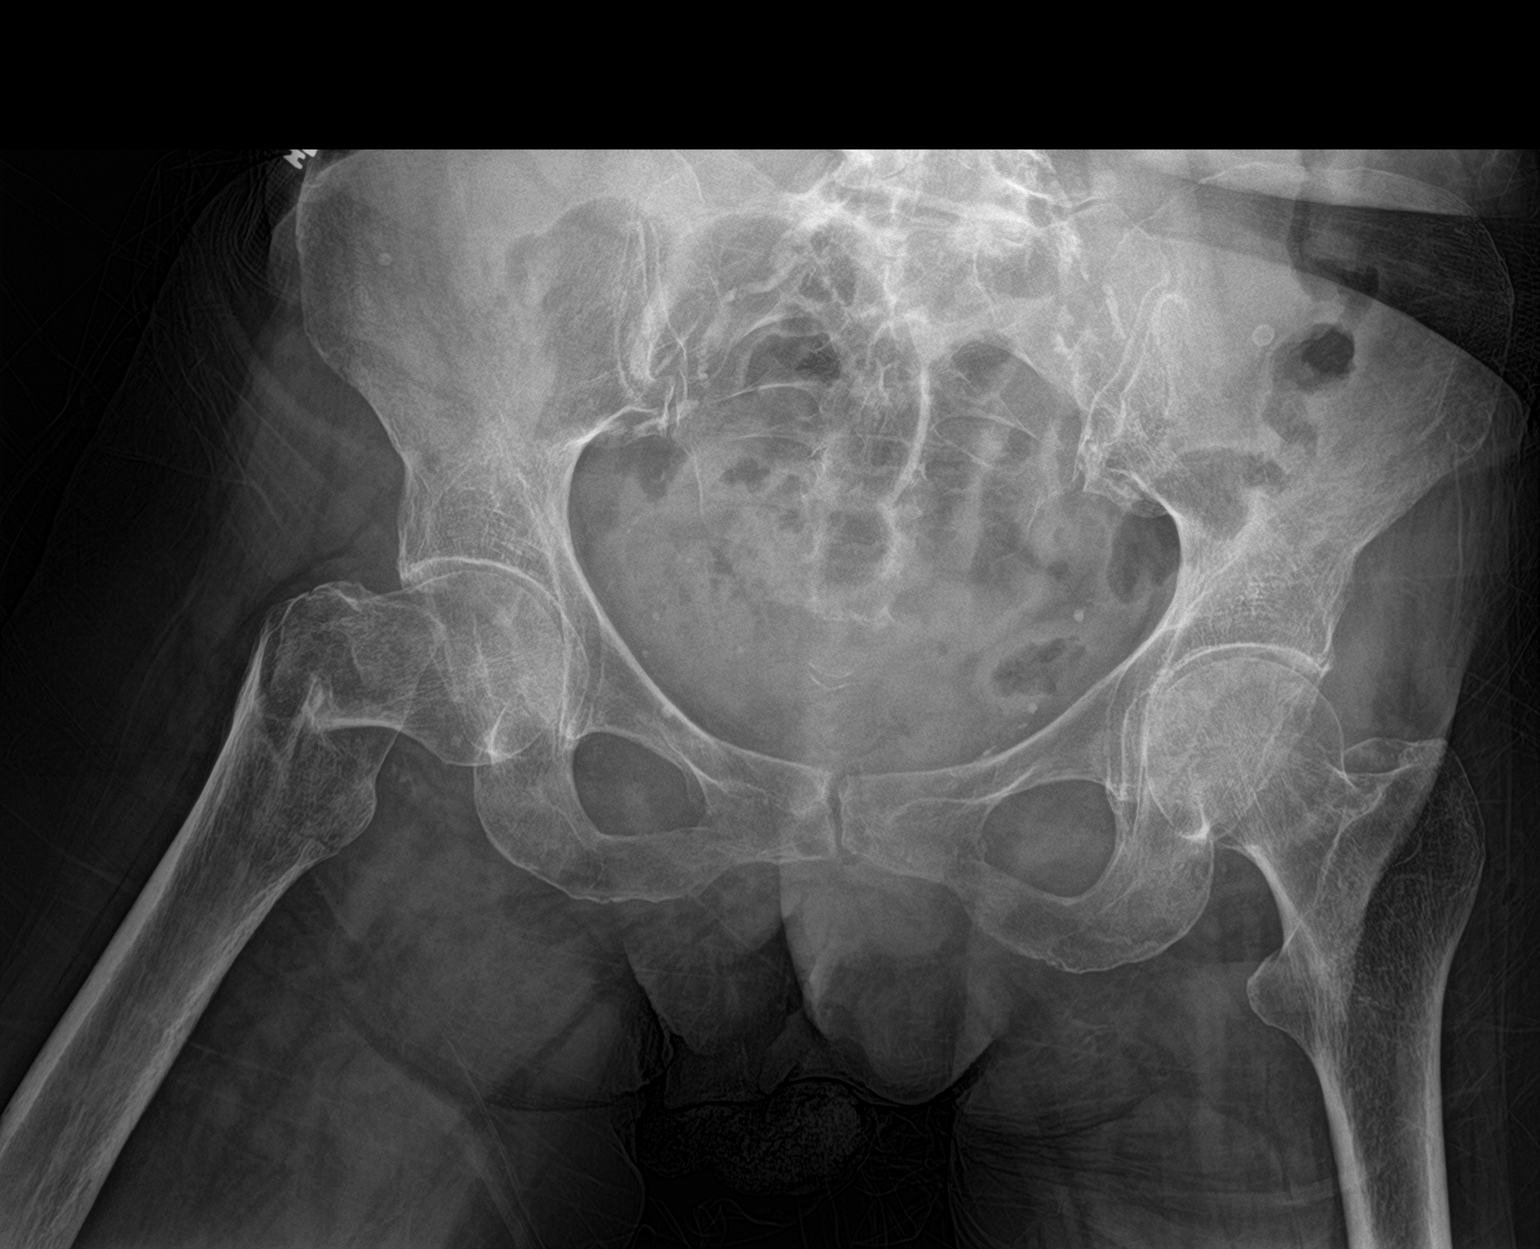

[1 of 1 positions shown; findings below may reference images not displayed]

FINDINGS: There is a right femoral intertrochanteric fracture with varus
angulation. No subluxation or dislocation. Mild symmetric
degenerative changes in the hips.
IMPRESSION: Right femoral intertrochanteric fracture with varus angulation.

## 2021-08-19 IMAGING — CT CT ABD-PELV W/O CM
2 of 4 series · 16 of 46 positions shown, 18 images · non-contrast
Comparison: None.

CLINICAL DATA: Abdominal distension

EXAM:
CT ABDOMEN AND PELVIS WITHOUT CONTRAST
TECHNIQUE: Multidetector CT imaging of the abdomen and pelvis was performed
following the standard protocol without IV contrast.

[Series 2: routine abd/pel wo · axial · 0.73mm/px · z∈[-876,-481]mm · 13 of 87 slices shown, 15 images]
[im 4/87  soft-tissue]
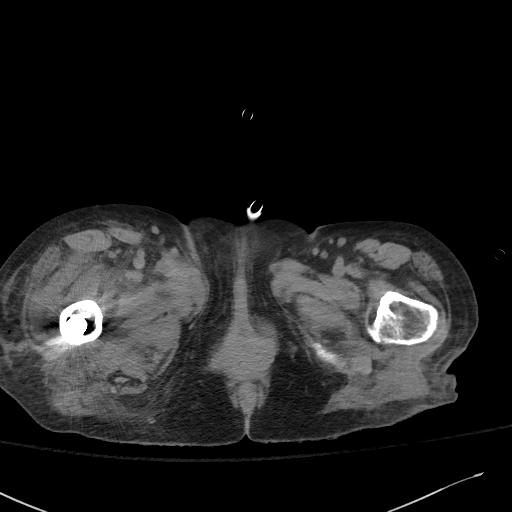
[im 4/87  bone]
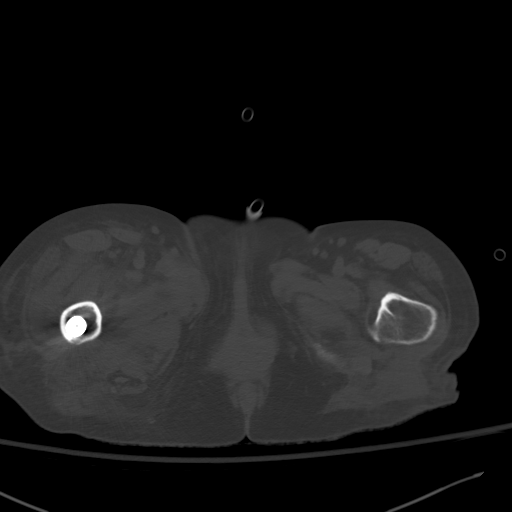
[im 11/87  soft-tissue]
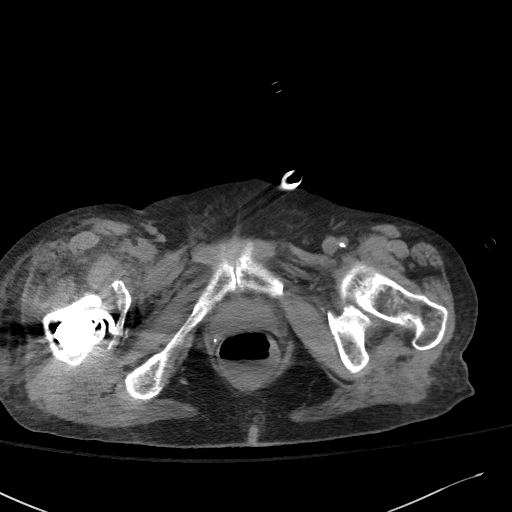
[im 18/87  soft-tissue]
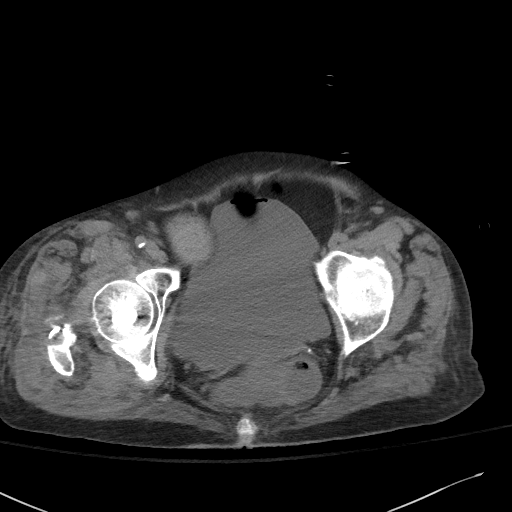
[im 25/87  soft-tissue]
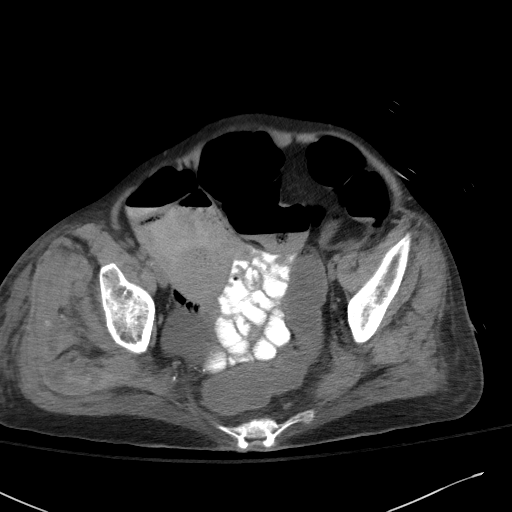
[im 31/87  soft-tissue]
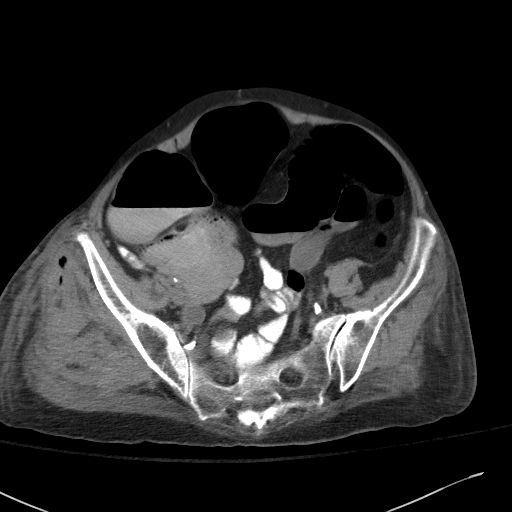
[im 38/87  soft-tissue]
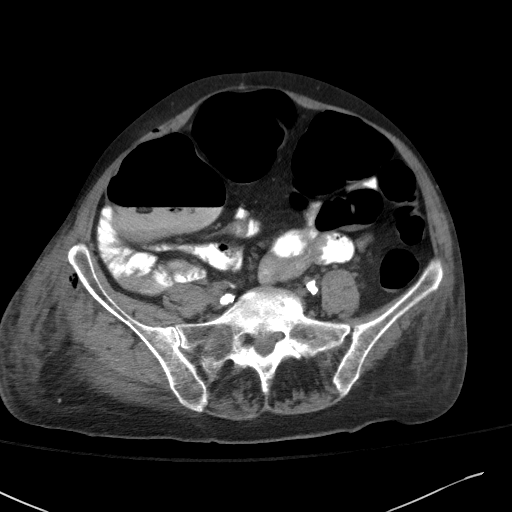
[im 45/87  soft-tissue]
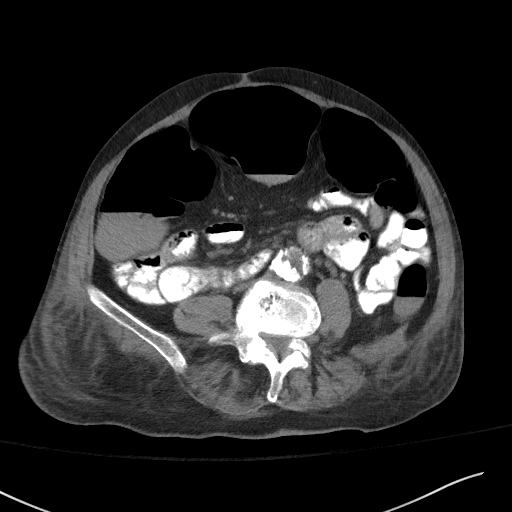
[im 49/87  soft-tissue]
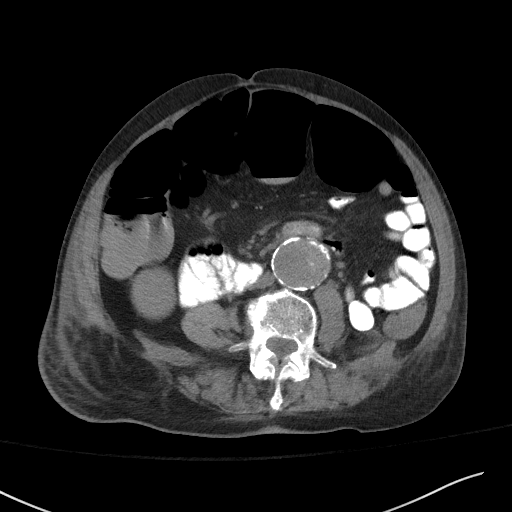
[im 56/87  soft-tissue]
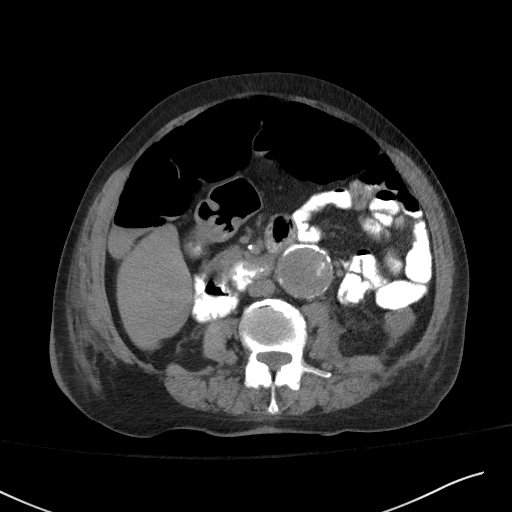
[im 56/87  bone]
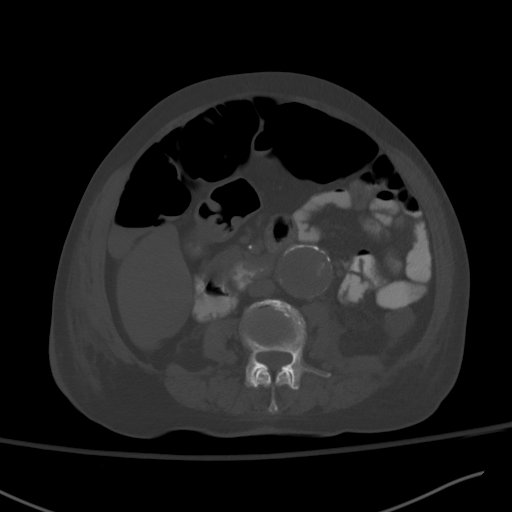
[im 62/87  soft-tissue]
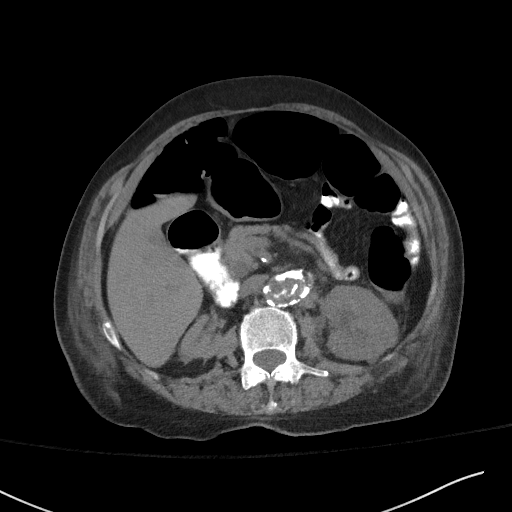
[im 69/87  soft-tissue]
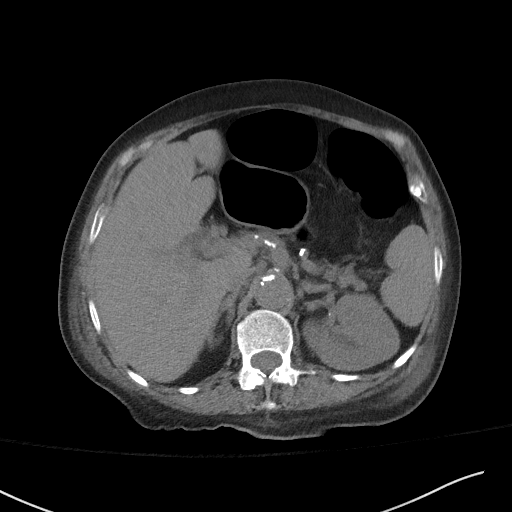
[im 76/87  soft-tissue]
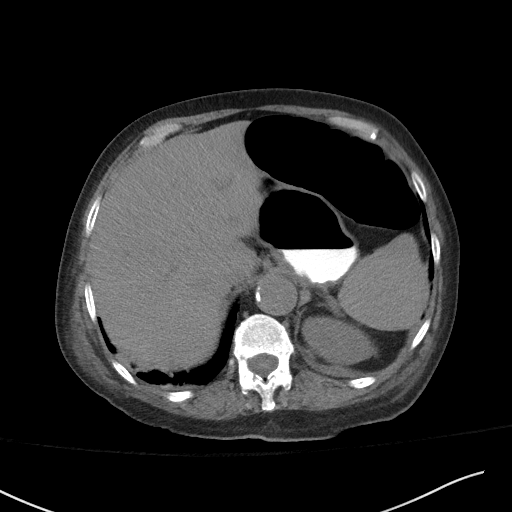
[im 83/87  soft-tissue]
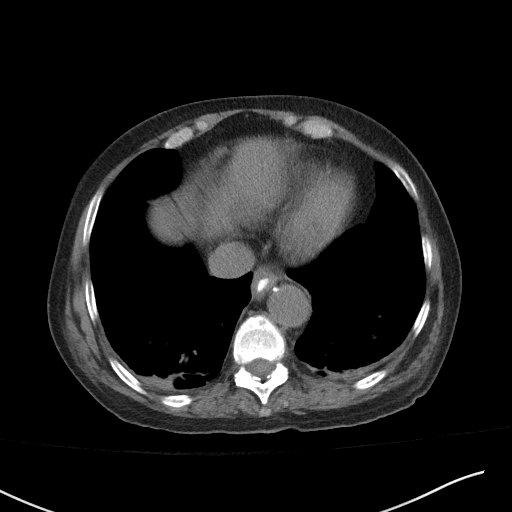

[Series 5: coronal st · coronal · 0.71mm/px · 3 of 94 slices shown]
[im 32/94  soft-tissue]
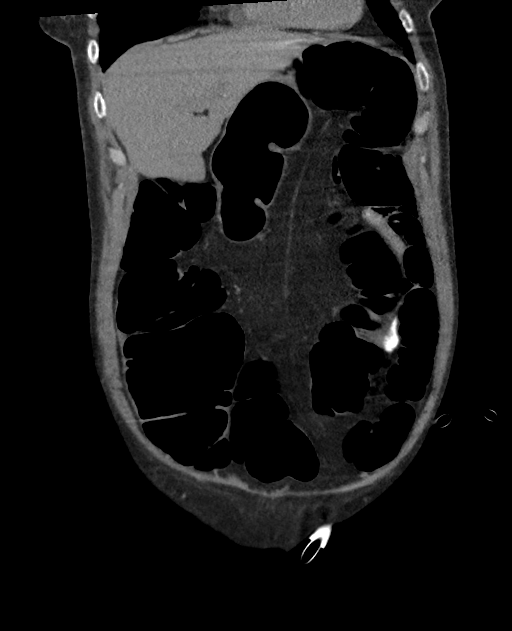
[im 42/94  soft-tissue]
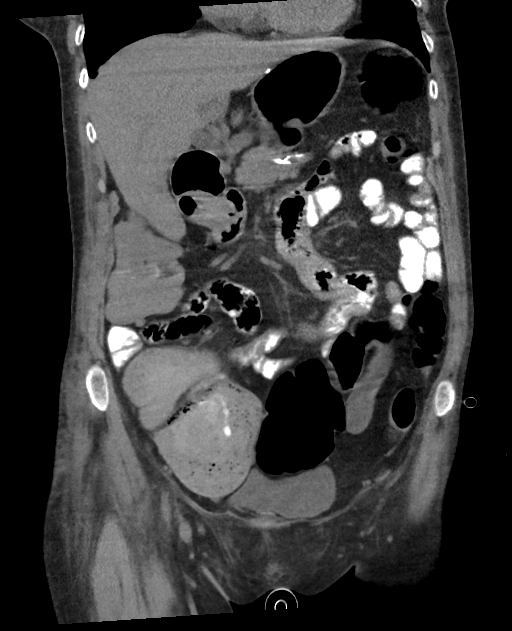
[im 52/94  soft-tissue]
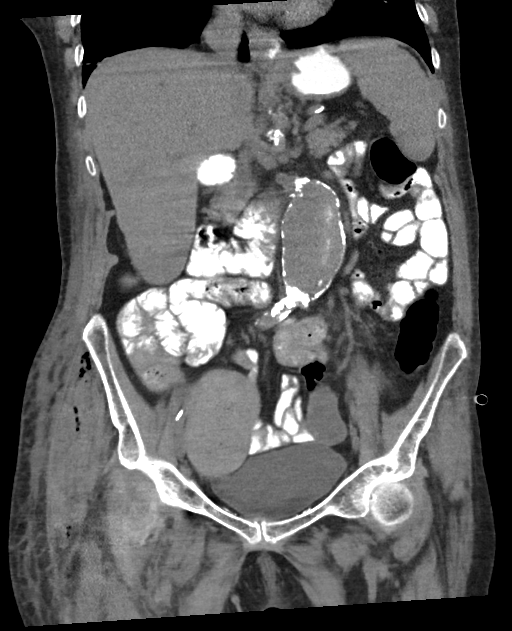

[16 of 46 positions shown; findings below may reference images not displayed]

FINDINGS: Lower chest: Mild atelectasis is seen within the posterior aspect of
the bilateral lung bases.

Hepatobiliary: No focal liver abnormality is seen. Status post
cholecystectomy. No biliary dilatation.

Pancreas: Unremarkable. No pancreatic ductal dilatation or
surrounding inflammatory changes.

Spleen: Normal in size without focal abnormality.

Adrenals/Urinary Tract: Adrenal glands are unremarkable. The right
kidney is atrophic in appearance. Mild compensatory hypertrophy of
the left kidney is seen. No focal lesions are identified. There is
mild right-sided perinephric inflammatory fat stranding. Mild
left-sided hydronephrosis and proximal hydroureter are seen. A small
amount of air is seen within the lumen of an otherwise moderately
distended urinary bladder.

Stomach/Bowel: A small hiatal hernia is seen. The appendix is not
clearly identified. Oral contrast is seen throughout the small bowel
which is normal in caliber. Air is seen throughout the course of the
large bowel, without evidence of stool.

Vascular/Lymphatic: There is marked severity calcification of the
abdominal aorta with approximately 4.4 cm x 4.3 cm aneurysmal
dilatation of the infrarenal abdominal aorta. No enlarged abdominal
or pelvic lymph nodes.

Reproductive: Status post hysterectomy. No adnexal masses.

Other: No abdominal wall hernia or abnormality. No abdominopelvic
ascites.

Musculoskeletal: A mild amount of intramuscular air is seen along
the lateral right pelvic wall. A metallic density intramedullary rod
and compression screw device are seen within the proximal right
femur.

Marked severity degenerative changes seen within the mid and lower
lumbar spine.
IMPRESSION: 1. 4.4 cm x 4.3 cm infrarenal abdominal aortic aneurysm.
2. Small hiatal hernia.
3. Evidence of prior cholecystectomy and hysterectomy.
4. Minimal stool burden without evidence of bowel obstruction.
5. Prior open reduction and internal fixation of the proximal right
femur with postoperative changes suspected within the soft tissues
along the right pelvic wall.

Aortic Atherosclerosis ([id number]).
